# Patient Record
Sex: Female | Born: 1980
Health system: Southern US, Community
[De-identification: ages and names within clinical notes are randomized; demographics above are authoritative.]

## PROBLEM LIST (undated history)

## (undated) DIAGNOSIS — I499 Cardiac arrhythmia, unspecified: Secondary | ICD-10-CM

## (undated) DIAGNOSIS — C801 Malignant (primary) neoplasm, unspecified: Secondary | ICD-10-CM

## (undated) DIAGNOSIS — R87619 Unspecified abnormal cytological findings in specimens from cervix uteri: Secondary | ICD-10-CM

## (undated) DIAGNOSIS — IMO0002 Reserved for concepts with insufficient information to code with codable children: Secondary | ICD-10-CM

## (undated) DIAGNOSIS — O24419 Gestational diabetes mellitus in pregnancy, unspecified control: Secondary | ICD-10-CM

## (undated) HISTORY — DX: Malignant (primary) neoplasm, unspecified: C80.1

## (undated) HISTORY — DX: Reserved for concepts with insufficient information to code with codable children: IMO0002

## (undated) HISTORY — DX: Unspecified abnormal cytological findings in specimens from cervix uteri: R87.619

## (undated) HISTORY — PX: NO PAST SURGERIES: SHX2092

## (undated) HISTORY — DX: Gestational diabetes mellitus in pregnancy, unspecified control: O24.419

---

## 2004-01-09 ENCOUNTER — Other Ambulatory Visit: Admission: RE | Admit: 2004-01-09 | Discharge: 2004-01-09 | Payer: Self-pay | Admitting: Obstetrics and Gynecology

## 2007-09-12 ENCOUNTER — Encounter: Admission: RE | Admit: 2007-09-12 | Discharge: 2007-09-12 | Payer: Self-pay | Admitting: Obstetrics and Gynecology

## 2009-01-26 ENCOUNTER — Encounter: Admission: RE | Admit: 2009-01-26 | Discharge: 2009-01-26 | Payer: Self-pay | Admitting: Obstetrics and Gynecology

## 2009-02-13 ENCOUNTER — Encounter: Admission: RE | Admit: 2009-02-13 | Discharge: 2009-02-13 | Payer: Self-pay | Admitting: Family Medicine

## 2009-02-20 ENCOUNTER — Encounter: Admission: RE | Admit: 2009-02-20 | Discharge: 2009-02-20 | Payer: Self-pay | Admitting: Family Medicine

## 2009-02-25 ENCOUNTER — Emergency Department: Payer: Self-pay | Admitting: Emergency Medicine

## 2009-04-21 ENCOUNTER — Encounter: Admission: RE | Admit: 2009-04-21 | Discharge: 2009-04-21 | Payer: Self-pay | Admitting: Obstetrics and Gynecology

## 2009-07-16 ENCOUNTER — Encounter: Admission: RE | Admit: 2009-07-16 | Discharge: 2009-07-16 | Payer: Self-pay | Admitting: Obstetrics and Gynecology

## 2012-09-07 LAB — OB RESULTS CONSOLE HEPATITIS B SURFACE ANTIGEN: Hepatitis B Surface Ag: NEGATIVE

## 2012-09-07 LAB — OB RESULTS CONSOLE ANTIBODY SCREEN: Antibody Screen: NEGATIVE

## 2012-09-07 LAB — OB RESULTS CONSOLE ABO/RH: RH Type: POSITIVE

## 2012-09-07 LAB — OB RESULTS CONSOLE RUBELLA ANTIBODY, IGM: Rubella: IMMUNE

## 2012-11-14 NOTE — L&D Delivery Note (Signed)
Delivery Note At 6:44 PM a viable and healthy female was delivered via Vaginal, Spontaneous Delivery (Presentation: Right Occiput Anterior).  APGAR: 8, 9; weight pending .   Placenta status: Intact, Spontaneous.  Cord: 3 vessels with the following complications: None.  Cord pH: na  Anesthesia: Epidural  Episiotomy: None Lacerations: 2nd degree;Perineal Suture Repair: 2.0 vicryl rapide Est. Blood Loss (mL): 200  Mom to postpartum.  Baby to nursery-stable.  Verta Riedlinger J 04/18/2013, 7:02 PM

## 2013-02-06 ENCOUNTER — Encounter: Payer: 59 | Attending: Obstetrics | Admitting: *Deleted

## 2013-02-06 VITALS — Ht 61.0 in | Wt 123.6 lb

## 2013-02-06 DIAGNOSIS — Z713 Dietary counseling and surveillance: Secondary | ICD-10-CM | POA: Insufficient documentation

## 2013-02-06 DIAGNOSIS — O99810 Abnormal glucose complicating pregnancy: Secondary | ICD-10-CM | POA: Insufficient documentation

## 2013-02-07 ENCOUNTER — Encounter: Payer: Self-pay | Admitting: *Deleted

## 2013-02-07 NOTE — Progress Notes (Signed)
  Patient was seen on 02/06/13 for Gestational Diabetes self-management class at the [facility]. The following learning objectives were met by the patient during this course:   States the definition of Gestational Diabetes  States why dietary management is important in controlling blood glucose  Describes the effects each nutrient has on blood glucose levels  Demonstrates ability to create a balanced meal plan  Demonstrates carbohydrate counting   States when to check blood glucose levels  Demonstrates proper blood glucose monitoring techniques  States the effect of stress and exercise on blood glucose levels  States the importance of limiting caffeine and abstaining from alcohol and smoking  Blood glucose monitor given: none.  Already had a meter Blood glucose reading: 106 mg/dl  Patient instructed to monitor glucose levels: FBS: 60 - <90 1 hour: <140 2 hour: <120  *Patient received handouts:  Nutrition Diabetes and Pregnancy  Carbohydrate Counting List  Patient will be seen for follow-up as needed.

## 2013-02-07 NOTE — Patient Instructions (Signed)
Goals:  Check glucose levels per MD as instructed  Follow Gestational Diabetes Diet as instructed  Call for follow-up as needed    

## 2013-03-18 LAB — OB RESULTS CONSOLE GBS: GBS: POSITIVE

## 2013-04-15 ENCOUNTER — Encounter (HOSPITAL_COMMUNITY): Payer: Self-pay | Admitting: *Deleted

## 2013-04-15 ENCOUNTER — Telehealth (HOSPITAL_COMMUNITY): Payer: Self-pay | Admitting: *Deleted

## 2013-04-15 NOTE — Telephone Encounter (Signed)
Preadmission screen  

## 2013-04-17 ENCOUNTER — Other Ambulatory Visit: Payer: Self-pay | Admitting: Obstetrics

## 2013-04-18 ENCOUNTER — Inpatient Hospital Stay (HOSPITAL_COMMUNITY)
Admission: AD | Admit: 2013-04-18 | Discharge: 2013-04-20 | DRG: 775 | Disposition: A | Discharge status: Home / Self Care | Payer: 59 | Source: Ambulatory Visit | Attending: Obstetrics and Gynecology | Admitting: Obstetrics and Gynecology

## 2013-04-18 ENCOUNTER — Inpatient Hospital Stay (HOSPITAL_COMMUNITY): Payer: 59 | Admitting: Anesthesiology

## 2013-04-18 ENCOUNTER — Encounter (HOSPITAL_COMMUNITY): Payer: Self-pay | Admitting: *Deleted

## 2013-04-18 ENCOUNTER — Encounter (HOSPITAL_COMMUNITY): Payer: Self-pay | Admitting: Anesthesiology

## 2013-04-18 DIAGNOSIS — O99892 Other specified diseases and conditions complicating childbirth: Secondary | ICD-10-CM | POA: Diagnosis present

## 2013-04-18 DIAGNOSIS — O99814 Abnormal glucose complicating childbirth: Secondary | ICD-10-CM | POA: Diagnosis present

## 2013-04-18 DIAGNOSIS — Z22330 Carrier of Group B streptococcus: Secondary | ICD-10-CM

## 2013-04-18 HISTORY — DX: Cardiac arrhythmia, unspecified: I49.9

## 2013-04-18 LAB — COMPREHENSIVE METABOLIC PANEL
ALT: 16 U/L (ref 0–35)
AST: 25 U/L (ref 0–37)
Albumin: 2.8 g/dL — ABNORMAL LOW (ref 3.5–5.2)
Alkaline Phosphatase: 185 U/L — ABNORMAL HIGH (ref 39–117)
Potassium: 3.8 mEq/L (ref 3.5–5.1)
Sodium: 136 mEq/L (ref 135–145)
Total Protein: 6.5 g/dL (ref 6.0–8.3)

## 2013-04-18 LAB — CBC
HCT: 39 % (ref 36.0–46.0)
Hemoglobin: 12.3 g/dL (ref 12.0–15.0)
MCH: 32.2 pg (ref 26.0–34.0)
MCV: 93.5 fL (ref 78.0–100.0)
Platelets: 149 10*3/uL — ABNORMAL LOW (ref 150–400)
RBC: 3.82 MIL/uL — ABNORMAL LOW (ref 3.87–5.11)
RDW: 13 % (ref 11.5–15.5)
WBC: 9.7 10*3/uL (ref 4.0–10.5)

## 2013-04-18 MED ORDER — FENTANYL 2.5 MCG/ML BUPIVACAINE 1/10 % EPIDURAL INFUSION (WH - ANES)
14.0000 mL/h | INTRAMUSCULAR | Status: DC | PRN
  Administered 2013-04-18: 14 mL/h via EPIDURAL
  Filled 2013-04-18: qty 125

## 2013-04-18 MED ORDER — SENNOSIDES-DOCUSATE SODIUM 8.6-50 MG PO TABS: 2.0000 | ORAL_TABLET | Freq: Every day | ORAL | Status: DC

## 2013-04-18 MED ORDER — OXYTOCIN 40 UNITS IN LACTATED RINGERS INFUSION - SIMPLE MED: 1.0000 m[IU]/min | INTRAVENOUS | Status: DC

## 2013-04-18 MED ORDER — LACTATED RINGERS IV SOLN
500.0000 mL | INTRAVENOUS | Status: DC | PRN
  Administered 2013-04-18 (×2): 300 mL via INTRAVENOUS

## 2013-04-18 MED ORDER — EPHEDRINE 5 MG/ML INJ
10.0000 mg | INTRAVENOUS | Status: DC | PRN
  Filled 2013-04-18: qty 4
  Filled 2013-04-18: qty 2

## 2013-04-18 MED ORDER — OXYTOCIN BOLUS FROM INFUSION
500.0000 mL | INTRAVENOUS | Status: DC
  Administered 2013-04-18: 500 mL via INTRAVENOUS

## 2013-04-18 MED ORDER — OXYCODONE-ACETAMINOPHEN 5-325 MG PO TABS: 1.0000 | ORAL_TABLET | ORAL | Status: DC | PRN

## 2013-04-18 MED ORDER — IBUPROFEN 600 MG PO TABS: 600.0000 mg | ORAL_TABLET | Freq: Four times a day (QID) | ORAL | Status: DC | PRN

## 2013-04-18 MED ORDER — OXYTOCIN 40 UNITS IN LACTATED RINGERS INFUSION - SIMPLE MED
62.5000 mL/h | INTRAVENOUS | Status: DC
  Filled 2013-04-18: qty 1000

## 2013-04-18 MED ORDER — PENICILLIN G POTASSIUM 5000000 UNITS IJ SOLR
2.5000 10*6.[IU] | INTRAVENOUS | Status: DC
  Administered 2013-04-18 (×2): 2.5 10*6.[IU] via INTRAVENOUS
  Filled 2013-04-18 (×5): qty 2.5

## 2013-04-18 MED ORDER — WITCH HAZEL-GLYCERIN EX PADS: 1.0000 "application " | MEDICATED_PAD | CUTANEOUS | Status: DC | PRN

## 2013-04-18 MED ORDER — CITRIC ACID-SODIUM CITRATE 334-500 MG/5ML PO SOLN: 30.0000 mL | ORAL | Status: DC | PRN

## 2013-04-18 MED ORDER — TERBUTALINE SULFATE 1 MG/ML IJ SOLN: 0.2500 mg | Freq: Once | INTRAMUSCULAR | Status: DC | PRN

## 2013-04-18 MED ORDER — LACTATED RINGERS IV SOLN
500.0000 mL | Freq: Once | INTRAVENOUS | Status: AC
  Administered 2013-04-18: 500 mL via INTRAVENOUS

## 2013-04-18 MED ORDER — DIPHENHYDRAMINE HCL 50 MG/ML IJ SOLN: 12.5000 mg | INTRAMUSCULAR | Status: DC | PRN

## 2013-04-18 MED ORDER — LACTATED RINGERS IV SOLN
INTRAVENOUS | Status: DC
  Administered 2013-04-18 (×2): via INTRAVENOUS

## 2013-04-18 MED ORDER — TETANUS-DIPHTH-ACELL PERTUSSIS 5-2.5-18.5 LF-MCG/0.5 IM SUSP: 0.5000 mL | Freq: Once | INTRAMUSCULAR | Status: DC

## 2013-04-18 MED ORDER — ACETAMINOPHEN 325 MG PO TABS: 650.0000 mg | ORAL_TABLET | ORAL | Status: DC | PRN

## 2013-04-18 MED ORDER — IBUPROFEN 600 MG PO TABS
600.0000 mg | ORAL_TABLET | Freq: Four times a day (QID) | ORAL | Status: DC
  Administered 2013-04-18 – 2013-04-20 (×7): 600 mg via ORAL
  Filled 2013-04-18 (×9): qty 1

## 2013-04-18 MED ORDER — ONDANSETRON HCL 4 MG/2ML IJ SOLN: 4.0000 mg | Freq: Four times a day (QID) | INTRAMUSCULAR | Status: DC | PRN

## 2013-04-18 MED ORDER — METHYLERGONOVINE MALEATE 0.2 MG PO TABS
0.2000 mg | ORAL_TABLET | ORAL | Status: DC | PRN
Start: 2013-04-18 — End: 2013-04-19

## 2013-04-18 MED ORDER — PHENYLEPHRINE 40 MCG/ML (10ML) SYRINGE FOR IV PUSH (FOR BLOOD PRESSURE SUPPORT)
80.0000 ug | PREFILLED_SYRINGE | INTRAVENOUS | Status: DC | PRN
  Filled 2013-04-18: qty 2

## 2013-04-18 MED ORDER — LIDOCAINE HCL (PF) 1 % IJ SOLN
INTRAMUSCULAR | Status: DC | PRN
  Administered 2013-04-18 (×4): 4 mL

## 2013-04-18 MED ORDER — PRENATAL MULTIVITAMIN CH
1.0000 | ORAL_TABLET | Freq: Every day | ORAL | Status: DC
  Filled 2013-04-18 (×2): qty 1

## 2013-04-18 MED ORDER — LANOLIN HYDROUS EX OINT: TOPICAL_OINTMENT | CUTANEOUS | Status: DC | PRN

## 2013-04-18 MED ORDER — LIDOCAINE HCL (PF) 1 % IJ SOLN
30.0000 mL | INTRAMUSCULAR | Status: DC | PRN
  Filled 2013-04-18 (×2): qty 30

## 2013-04-18 MED ORDER — BENZOCAINE-MENTHOL 20-0.5 % EX AERO
1.0000 "application " | INHALATION_SPRAY | CUTANEOUS | Status: DC | PRN
  Filled 2013-04-18: qty 56

## 2013-04-18 MED ORDER — METHYLERGONOVINE MALEATE 0.2 MG/ML IJ SOLN: 0.2000 mg | INTRAMUSCULAR | Status: DC | PRN

## 2013-04-18 MED ORDER — ZOLPIDEM TARTRATE 5 MG PO TABS: 5.0000 mg | ORAL_TABLET | Freq: Every evening | ORAL | Status: DC | PRN

## 2013-04-18 MED ORDER — OXYTOCIN 40 UNITS IN LACTATED RINGERS INFUSION - SIMPLE MED
1.0000 m[IU]/min | INTRAVENOUS | Status: DC
  Administered 2013-04-18: 2 m[IU]/min via INTRAVENOUS

## 2013-04-18 MED ORDER — DEXTROSE 5 % IV SOLN
5.0000 10*6.[IU] | Freq: Once | INTRAVENOUS | Status: AC
  Administered 2013-04-18: 5 10*6.[IU] via INTRAVENOUS
  Filled 2013-04-18: qty 5

## 2013-04-18 MED ORDER — PHENYLEPHRINE 40 MCG/ML (10ML) SYRINGE FOR IV PUSH (FOR BLOOD PRESSURE SUPPORT)
80.0000 ug | PREFILLED_SYRINGE | INTRAVENOUS | Status: DC | PRN
  Filled 2013-04-18: qty 5
  Filled 2013-04-18: qty 2

## 2013-04-18 MED ORDER — ONDANSETRON HCL 4 MG/2ML IJ SOLN: 4.0000 mg | INTRAMUSCULAR | Status: DC | PRN

## 2013-04-18 MED ORDER — ONDANSETRON HCL 4 MG PO TABS: 4.0000 mg | ORAL_TABLET | ORAL | Status: DC | PRN

## 2013-04-18 MED ORDER — SIMETHICONE 80 MG PO CHEW: 80.0000 mg | CHEWABLE_TABLET | ORAL | Status: DC | PRN

## 2013-04-18 MED ORDER — FLEET ENEMA 7-19 GM/118ML RE ENEM: 1.0000 | ENEMA | RECTAL | Status: DC | PRN

## 2013-04-18 MED ORDER — DIBUCAINE 1 % RE OINT: 1.0000 "application " | TOPICAL_OINTMENT | RECTAL | Status: DC | PRN

## 2013-04-18 MED ORDER — DIPHENHYDRAMINE HCL 25 MG PO CAPS: 25.0000 mg | ORAL_CAPSULE | Freq: Four times a day (QID) | ORAL | Status: DC | PRN

## 2013-04-18 MED ORDER — EPHEDRINE 5 MG/ML INJ
10.0000 mg | INTRAVENOUS | Status: DC | PRN
  Filled 2013-04-18: qty 2

## 2013-04-18 NOTE — Anesthesia Procedure Notes (Signed)
Epidural Patient location during procedure: OB Start time: 04/18/2013 11:44 AM  Staffing Performed by: anesthesiologist   Preanesthetic Checklist Completed: patient identified, site marked, surgical consent, pre-op evaluation, timeout performed, IV checked, risks and benefits discussed and monitors and equipment checked  Epidural Patient position: sitting Prep: site prepped and draped and DuraPrep Patient monitoring: continuous pulse ox and blood pressure Approach: midline Injection technique: LOR air  Needle:  Needle type: Tuohy  Needle gauge: 17 G Needle length: 9 cm and 9 Needle insertion depth: 5 cm cm Catheter type: closed end flexible Catheter size: 19 Gauge Catheter at skin depth: 10 cm Test dose: negative  Assessment Events: blood aspirated, injection not painful, no injection resistance, negative IV test and no paresthesia  Additional Notes Discussed risk of headache, infection, bleeding, nerve injury and failed or incomplete block.  Patient voices understanding and wishes to proceed.  First attempt with heme from catheter - catheter removed without test dosing.  Copious heme from skin puncture.  Pressure held for over 5 minutes to achieve hemostasis.   Second attempt at L4-5, uneventful placement.  No significant bleeding from skin puncture at this site.  Heme-tinged aspirate from epidural space - feel it is from previous epidural vein puncture.  All test doses negative and patient comfortable after bolusing.  Jasmine December, MD  Reason for block:procedure for pain

## 2013-04-18 NOTE — Progress Notes (Signed)
Kristy [name] is a 32 y.o. G1P0 at [redacted]w[redacted]d by LMP admitted for active labor  Subjective: Appears comfortable  Objective: BP 123/83  Pulse 60  Temp(Src) 97.7 F (36.5 C) (Oral)  Resp 20  Ht 5\' 1"  (1.549 m)  Wt 59.24 kg (130 lb 9.6 oz)  BMI 24.69 kg/m2  LMP 07/06/2012      FHT:  FHR: 145 bpm, variability: moderate,  accelerations:  Present,  decelerations:  Absent UC:   irregular, every 6 minutes SVE:   Dilation: 3.5 Effacement (%): 70 Station: -1 Exam by:: Mekhia Brogan, MD AROM- ? clear  Labs: Lab Results  Component Value Date   WBC 9.7 04/18/2013   HGB 13.5 04/18/2013   HCT 39.0 04/18/2013   MCV 92.9 04/18/2013   PLT 149* 04/18/2013   CMP     Component Value Date/Time   NA 136 04/18/2013 0850   K 3.8 04/18/2013 0850   CL 105 04/18/2013 0850   CO2 22 04/18/2013 0850   GLUCOSE 76 04/18/2013 0850   BUN 10 04/18/2013 0850   CREATININE 0.68 04/18/2013 0850   CALCIUM 9.1 04/18/2013 0850   PROT 6.5 04/18/2013 0850   ALBUMIN 2.8* 04/18/2013 0850   AST 25 04/18/2013 0850   ALT 16 04/18/2013 0850   ALKPHOS 185* 04/18/2013 0850   BILITOT 0.2* 04/18/2013 0850   GFRNONAA >90 04/18/2013 0850   GFRAA >90 04/18/2013 0850     Assessment / Plan: Spontaneous labor, progressing normally, but hypotonic GBS pos on PCN GDM- BS 75 Elevated BP- improved, labs stable  Labor: Progressing normally- add Pitocin Preeclampsia:  labs stable Fetal Wellbeing:  Category I Pain Control:  Labor support without medications I/D:  n/a Anticipated MOD:  NSVD  Efraim Vanallen J 04/18/2013, 10:10 AM

## 2013-04-18 NOTE — Progress Notes (Signed)
Pt states she is feeling shaky. Pt requests to have her blood sugar taken.

## 2013-04-18 NOTE — Anesthesia Preprocedure Evaluation (Signed)
Anesthesia Evaluation  Patient identified by MRN, date of birth, ID band Patient awake    Reviewed: Allergy & Precautions, H&P , NPO status , Patient's Chart, lab work & pertinent test results, reviewed documented beta blocker date and time   History of Anesthesia Complications Negative for: history of anesthetic complications  Airway Mallampati: I TM Distance: >3 FB Neck ROM: full    Dental  (+) Teeth Intact   Pulmonary neg pulmonary ROS,  breath sounds clear to auscultation        Cardiovascular negative cardio ROS  Rhythm:regular Rate:Normal     Neuro/Psych PSYCHIATRIC DISORDERS (anxiety/depression) negative neurological ROS     GI/Hepatic Neg liver ROS, GERD-  ,  Endo/Other  diabetes, Gestational  Renal/GU negative Renal ROS  negative genitourinary   Musculoskeletal   Abdominal   Peds  Hematology negative hematology ROS (+)   Anesthesia Other Findings   Reproductive/Obstetrics (+) Pregnancy                           Anesthesia Physical Anesthesia Plan  ASA: II  Anesthesia Plan: Epidural   Post-op Pain Management:    Induction:   Airway Management Planned:   Additional Equipment:   Intra-op Plan:   Post-operative Plan:   Informed Consent: I have reviewed the patients History and Physical, chart, labs and discussed the procedure including the risks, benefits and alternatives for the proposed anesthesia with the patient or authorized representative who has indicated his/her understanding and acceptance.     Plan Discussed with:   Anesthesia Plan Comments:         Anesthesia Quick Evaluation

## 2013-04-18 NOTE — Progress Notes (Signed)
ZYLA DASCENZO is a 32 y.o. G1P0 at [redacted]w[redacted]d by LMP admitted for active labor.  Subjective: Uncomfortable  Objective: BP 136/79  Pulse 53  Temp(Src) 97.4 F (36.3 C) (Oral)  Resp 20  LMP 07/06/2012      FHT:  FHR: 155 bpm, variability: moderate,  accelerations:  Present,  decelerations:  Absent UC:   regular, every 5 minutes SVE:   Dilation: 2.5 Effacement (%): 80 Station: -2 Exam by:: B Mosca RN  Labs: pending No results found for this basename: WBC, HGB, HCT, MCV, PLT    Assessment / Plan: Spontaneous labor, progressing normally GDM GBS  Labor: Progressing normally Preeclampsia:  na Fetal Wellbeing:  Category I Pain Control:  Labor support without medications I/D:  n/a Anticipated MOD:  NSVD  Espiridion Supinski J 04/18/2013, 8:58 AM

## 2013-04-18 NOTE — MAU Note (Signed)
Pt reports bleeding since 1830 last night.  Pt also reports cramping and lower back pain.

## 2013-04-19 ENCOUNTER — Inpatient Hospital Stay (HOSPITAL_COMMUNITY): Admission: RE | Admit: 2013-04-19 | Payer: 59 | Source: Ambulatory Visit

## 2013-04-19 LAB — CBC
HCT: 29.9 % — ABNORMAL LOW (ref 36.0–46.0)
Hemoglobin: 10.3 g/dL — ABNORMAL LOW (ref 12.0–15.0)
MCHC: 34.4 g/dL (ref 30.0–36.0)
WBC: 12.3 10*3/uL — ABNORMAL HIGH (ref 4.0–10.5)

## 2013-04-19 NOTE — Progress Notes (Signed)
PPD 1 SVD  S:  Reports feeling tired - still sleeping intermittently this am             Tolerating po/ No nausea or vomiting             Bleeding is light             Pain controlled with motrin and percocet             Up ad lib / ambulatory / voiding QS  Newborn breast feeding  / female O:               VS: BP 108/71  Pulse 60  Temp(Src) 97.9 F (36.6 C) (Oral)  Resp 18  Ht 5\' 1"  (1.549 m)  Wt 59.24 kg (130 lb 9.6 oz)  BMI 24.69 kg/m2  SpO2 97%  LMP 07/06/2012   LABS:  Recent Labs  04/18/13 1915 04/19/13 0615  WBC 13.2* 12.3*  HGB 12.3 10.3*  PLT 131* 124*                                     I&O: Intake/Output     06/05 0701 - 06/06 0700 06/06 0701 - 06/07 0700   Urine (mL/kg/hr) 950 (0.7)    Blood 200 (0.1)    Total Output 1150     Net -1150                      Physical Exam:             Alert and oriented X3  Abdomen: soft, non-tender, non-distended              Fundus: firm, non-tender, Ueven  Perineum: ice pack in place  Lochia: light  Extremities: no edema, no calf pain or tenderness   A: PPD # 1   Doing well - stable status  P: Routine post partum orders  Anticipate DC tomorrow AM  Marlinda Mike CNM, MSN, [facility] 04/19/2013, 8:24 AM

## 2013-04-19 NOTE — H&P (Signed)
NAMESALENE, [name]              ACCOUNT NO.:  [id number]  MEDICAL RECORD NO.:  [id number]  LOCATION:  9120                          FACILITY:  [facility]  PHYSICIAN:  Lenoard Aden, M.D.DATE OF BIRTH:  09-21-1981  DATE OF ADMISSION:  04/18/2013 DATE OF DISCHARGE:                             HISTORY & PHYSICAL   CHIEF COMPLAINT:  Active labor.  HISTORY OF PRESENT ILLNESS:  She is a 32 year old white female, G1, P0 at 40-4/7th weeks gestation, who presents with increased frequency of contractions today.  She has allergies to GARLIC and PREDNISONE.  MEDICATIONS:  Include prenatal vitamins and no other.  She is a nonsmoker, nondrinker.  She denies domestic or physical violence.  Personal history of insomnia, depression and poor domestic situation, anxiety, all on no medications.  FAMILY HISTORY:  Heart disease, breast cancer, prostate cancer, and hypertension.  Pregnancy complicated by GBS positivity and gestational diabetes.  Diet, well controlled.  Ultrasounds, up-to-date with appropriately grown fetus.  PHYSICAL EXAMINATION:  GENERAL:  She is a well-developed, well- nourished, white female, in no acute distress. HEENT:  Normal. NECK:  Supple.  Full range of motion. LUNGS:  Clear. HEART:  Regular rhythm. ABDOMEN:  Soft, gravid, nontender.  Estimated fetal weight per ultrasound was 7.5 pounds.  Cervix is 3-4 cm, 80%, vertex, -1. EXTREMITIES:  There are no cords. NEUROLOGIC:  Nonfocal. SKIN:  Intact.  IMPRESSION: 1. A 40-week intrauterine pregnancy. 2. Elevated blood pressure.  No signs and symptoms of preeclampsia.     Labs normal. 3. Gestational diabetes, diet controlled. 4. GBS positive.  PLAN:  Penicillin, Pitocin augmentation.  Anticipate attempts at vaginal delivery.     Lenoard Aden, M.D.     RJT/MEDQ  D:  04/18/2013  T:  04/18/2013  Job:  [id number]

## 2013-04-19 NOTE — Anesthesia Postprocedure Evaluation (Signed)
  Anesthesia Post-op Note  Patient: Kristy [name]  Procedure(s) Performed: * No procedures listed *  Patient Location: Mother/Baby  Anesthesia Type:Epidural  Level of Consciousness: awake, alert  and oriented  Airway and Oxygen Therapy: Patient Spontanous Breathing  Post-op Pain: moderate, pain @ epidural site, however no bruising or bleeding present when assesed by self, pt reassured the achiness in normal & shld resolve w/ time, Dr Jean Rosenthal notifed  Post-op Assessment: Post-op Vital signs reviewed, Pain level controlled, No headache, No residual numbness and No residual motor weakness  Post-op Vital Signs: Reviewed and stable  Complications: No apparent anesthesia complications

## 2013-04-19 NOTE — Progress Notes (Signed)
CSW attempted to meet with pt however her spouse was present.  CSW will return when he is no longer visiting.

## 2013-04-19 NOTE — Lactation Note (Signed)
This note was copied from the chart of Girl Darlette Dubow. Lactation Consultation Note  Patient Name: Girl Meklit Cotta BJYNW'G Date: 04/19/2013 Reason for consult: Initial assessment   Maternal Data Formula Feeding for Exclusion: No Infant to breast within first hour of birth: Yes Has patient been taught Hand Expression?: Yes Does the patient have breastfeeding experience prior to this delivery?: No  Feeding Feeding Type: Breast Milk Feeding method: Breast  LATCH Score/Interventions Latch: Too sleepy or reluctant, no latch achieved, no sucking elicited. Intervention(s): Adjust position;Assist with latch;Breast compression  Audible Swallowing: None  Type of Nipple: Everted at rest and after stimulation Intervention(s): Hand pump  Comfort (Breast/Nipple): Soft / non-tender     Hold (Positioning): Assistance needed to correctly position infant at breast and maintain latch. Intervention(s): Breastfeeding basics reviewed;Support Pillows;Skin to skin;Position options  LATCH Score: 5  Lactation Tools Discussed/Used     Consult Status Consult Status: Follow-up Date: 04/20/13 Follow-up type: In-patient  Initial visit with mom. She reports that when the baby is awake she latches well. Mom reports that she is having some difficulty with positioning. Reviewed wide open mouth and having the baby deep onto the breast. Attempted to latch baby but she is too sleepy. Skin to skin with mom. BFf brochure given with resources for support after DC. No questions at present. To call for assist prn  Pamelia Hoit 04/19/2013, 11:31 AM

## 2013-04-20 MED ORDER — OXYCODONE-ACETAMINOPHEN 5-325 MG PO TABS: 1.0000 | ORAL_TABLET | ORAL | Status: DC | PRN

## 2013-04-20 MED ORDER — IBUPROFEN 600 MG PO TABS: 600.0000 mg | ORAL_TABLET | Freq: Four times a day (QID) | ORAL | Status: DC

## 2013-04-20 NOTE — Discharge Summary (Signed)
Obstetric Discharge Summary  Reason for Admission: onset of labor Prenatal Procedures: none Intrapartum Procedures: spontaneous vaginal delivery and GBS prophylaxis Postpartum Procedures: none Complications-Operative and Postpartum: 2nd degree perineal laceration Hemoglobin  Date Value Range Status  04/19/2013 10.3* 12.0 - 15.0 g/dL Final     HCT  Date Value Range Status  04/19/2013 29.9* 36.0 - 46.0 % Final    Physical Exam:  General: alert, cooperative and no distress Lochia: appropriate Uterine Fundus: firm Incision: healing well DVT Evaluation: No evidence of DVT seen on physical exam.  Discharge Diagnoses: Term Pregnancy-delivered  Discharge Information: Date: 04/20/2013 Activity: pelvic rest Diet: routine Medications: PNV, Ibuprofen and Percocet Condition: stable Instructions: refer to practice specific booklet Discharge to: home Follow-up Information   Follow up with Lenoard Aden, MD. Schedule an appointment as soon as possible for a visit in 6 weeks.   Contact information:   [address] [place] [address] [phone]       Newborn Data: Live born female  Birth Weight: 6 lb 10.5 oz (3020 g) APGAR: 8, 9  Home with mother.  Marlinda Mike 04/20/2013, 8:46 AM

## 2013-04-20 NOTE — Clinical Social Work Note (Signed)
CSW attempted to see MOB again, however lactation was working with MOB and infant.  CSW will return to see MOB before discharge today.    [phone] 

## 2013-04-20 NOTE — Progress Notes (Signed)
PPD 2 SVD  S:  Reports feeling well - better today / some backache at epidural site             Tolerating po/ No nausea or vomiting             Bleeding is light             Pain controlled with motrin and percocet             Up ad lib / ambulatory / voiding QS  Newborn breast feeding   O:               VS: BP 116/71  Pulse 98  Temp(Src) 98.4 F (36.9 C) (Oral)  Resp 18  Ht 5\' 1"  (1.549 m)  Wt 59.24 kg (130 lb 9.6 oz)  BMI 24.69 kg/m2  SpO2 97%  LMP 07/06/2012              Physical Exam:             Alert and oriented X3  Abdomen: soft, non-tender, non-distended              Fundus: firm, non-tender, U-1  Perineum: mild edema  Lochia: light  Extremities: no edema, no calf pain or tenderness  A: PPD # 2   Doing well - stable status  P: Routine post partum orders  DC home             WOB booklet / instructions reviewed  Marlinda Mike CNM, MSN, [facility] 04/20/2013, 8:43 AM

## 2013-04-20 NOTE — Progress Notes (Signed)
Infant staying as a baby patient.

## 2013-04-20 NOTE — Clinical Social Work Note (Signed)
CSW spoke with MOB. No barriers to discharge at this time.  Full consult report to follow.   [phone]

## 2013-04-21 NOTE — Clinical Social Work Note (Signed)
Late Entry  CSW spoke alone with MOB at bedside at late evening 04/20/13.  MOB reports no current concerns with anxiety or depression at this time.  CSW discussed hx of separation.  MOB reports some marital issues during pregnancy, however none currently.  MOB did not want to go into detail with CSW.  MOB reports no hx or concerns with DV or any safety issues.  Please reconsult CSW if further needs arise.   [phone]

## 2013-04-29 ENCOUNTER — Ambulatory Visit: Payer: Self-pay

## 2013-04-29 NOTE — Lactation Note (Signed)
This note was copied from the chart of Melodye Ped. Adult Lactation Consultation Outpatient Visit Note  Patient Name: Makalya Nave Date of Birth: 04/18/2013 Gestational Age at Delivery: Unknown Type of Delivery:   Breastfeeding History: Frequency of Breastfeeding: Q 2-4 hours- feeds more often in the daytime Length of Feeding: 30-45 Voids: Lots Has 2 voids while here for appointment Stools: Lots- had 2 stools while here  Supplementing / Method: Pumping:  Type of Pump: Medela   Frequency:4-5 times/ Day   Volume:  30-120 cc's  Comments:    Consultation Evaluation:  Initial Feeding Assessment: Pre-feed Weight: 7-2.3  3240g Post-feed Weight:7- 4.5  3304 Amount Transferred:64 cc's Comments: Baby latched well to right breast in cradle position. After I untucked baby's bottom lip, mom reports that latch feels fine. Baby nursed for 15 minutes with lots of swallows noted. Encouragement given.  Additional Feeding Assessment:Diaper change Pre-feed Weight: 7-3.7  3280 Post-feed Weight: 7- 4.0  3288 Amount Transferred:8 cc's Comments: Avery latched but did not stay on the breast for very long. Alert but not showing any feeding cues. Reviewed positioning Avery at the breast in football position. Mom is doing lots of pumping and bottle feeding EBM. Encouraged to put the baby to the breast every feeding and to pump if Denny Peon does not empty the breast. No further questions at present. To call prn    Total Breast milk Transferred this Visit: 73 cc's Total Supplement Given: mom was feeding Denny Peon EBM from a bottle before we got started- about 20 cc's  Additional Interventions:   Follow-Up With Ped [facility] as resource for support and weight check.     Pamelia Hoit 04/29/2013, 2:34 PM

## 2013-05-01 NOTE — Progress Notes (Signed)
Post discharge chart review completed.  

## 2013-06-17 ENCOUNTER — Ambulatory Visit (HOSPITAL_COMMUNITY)
Admission: RE | Admit: 2013-06-17 | Discharge: 2013-06-17 | Disposition: A | Discharge status: Home / Self Care | Payer: 59 | Source: Ambulatory Visit | Attending: Obstetrics | Admitting: Obstetrics

## 2013-06-17 NOTE — Lactation Note (Signed)
Adult Lactation Consultation Outpatient Visit Note  Patient Name: Kristy [name] Date of Birth: 12/09/1980 Gestational Age at Delivery: Unknown Type of Delivery:   Breastfeeding History: Frequency of Breastfeeding:  Length of Feeding:  Voids:  Stools:   Supplementing / Method: Pumping:  Type of Pump:   Frequency:  Volume:    Comments: Kristy [name]'s infant is 38 weeks old. She has been complaining of pain in lower (L) breast since July 6th . She states that she saw Dr. Ernestina Penna last week. She states that Dr. Ernestina Penna did a breast exam and was unable to see a cause.  Kristy [name] describes her pain as random and shooting. She states maybe days or weeks between pain in (L) breast. She states she was unsure if pumping her breast has caused this pain. She has had difficulty with a faulty pump but now she has a new one. She has changed flange sizes frequently and is now using a #27 flange. She denies having had any fever or chills or flu like symptoms. Kristy [name] states that her Mother has history of Breast CA at 75 years old. Kristy [name] has had several 3-D ultrasounds and states that there were no significant findings other than fibrocystic breast disease.    Consultation Evaluation: Breast are full and slightly firm. Mother doesn't have any break or redness in nipple tissue. Assessed both breast and observed a small round knot at 6 o'clock on (L) breast. Area approximately 1 cm in size. Area is approximately 1-2 inches below nipple and 1 inch below pressure point from flange. No observed redness on breast and no plugged duct observed.  Assist mother with pumping breast for 20 mins. Mother described shooting pain about 15 mins into pumping cession. She states that pain scale rises to a scale of #6. Only moments of pain then the pain is gone and leaves breast sore.   Mothers breast were emptied with pumping for 20 mins. Firm area still present after good breast massage. Mother states area is still slightly  tender. She states that the pain feels more like a pulled muscle.   Mother has mild to moderate  anxiety about pain . I suggested that she seek another ultrasound since it has been two years since last one. Mother doesn't complain of  any signs of plugged ducts or  Signs or symptoms of Mastitis.     Additional Interventions: Mother encouraged  To seek another 3-D Ultrasound if Dr Ernestina Penna thinks is necessary.  Advised mother to minimize pressure on breast with flanges. Mother to decrease caffeine in diet  Follow-Up  PRN     Stevan Born [facility] 06/17/2013, 2:40 PM

## 2014-09-15 ENCOUNTER — Encounter (HOSPITAL_COMMUNITY): Payer: Self-pay | Admitting: *Deleted

## 2015-12-07 DIAGNOSIS — Z1151 Encounter for screening for human papillomavirus (HPV): Secondary | ICD-10-CM | POA: Diagnosis not present

## 2015-12-07 DIAGNOSIS — Z01419 Encounter for gynecological examination (general) (routine) without abnormal findings: Secondary | ICD-10-CM | POA: Diagnosis not present

## 2015-12-07 MED FILL — TERCONAZOLE 0.8% VAGINAL CR: 0.8 | 3 days supply | Qty: 20 | Fill #0

## 2015-12-10 LAB — HM PAP SMEAR: HM PAP: NEGATIVE

## 2015-12-15 ENCOUNTER — Encounter: Payer: Self-pay | Admitting: Family Medicine

## 2015-12-15 ENCOUNTER — Ambulatory Visit (INDEPENDENT_AMBULATORY_CARE_PROVIDER_SITE_OTHER): Payer: 59 | Admitting: Family Medicine

## 2015-12-15 VITALS — BP 120/80 | HR 64 | Ht 60.0 in | Wt 98.6 lb

## 2015-12-15 DIAGNOSIS — Z7189 Other specified counseling: Secondary | ICD-10-CM

## 2015-12-15 DIAGNOSIS — Z0000 Encounter for general adult medical examination without abnormal findings: Secondary | ICD-10-CM | POA: Diagnosis not present

## 2015-12-15 DIAGNOSIS — Z8632 Personal history of gestational diabetes: Secondary | ICD-10-CM | POA: Diagnosis not present

## 2015-12-15 DIAGNOSIS — Z8349 Family history of other endocrine, nutritional and metabolic diseases: Secondary | ICD-10-CM

## 2015-12-15 DIAGNOSIS — Z7689 Persons encountering health services in other specified circumstances: Secondary | ICD-10-CM

## 2015-12-15 DIAGNOSIS — Z83438 Family history of other disorder of lipoprotein metabolism and other lipidemia: Secondary | ICD-10-CM

## 2015-12-15 LAB — POCT URINALYSIS DIPSTICK
Bilirubin, UA: NEGATIVE
Blood, UA: NEGATIVE
Glucose, UA: NEGATIVE
KETONES UA: NEGATIVE
Nitrite, UA: NEGATIVE
PH UA: 6
PROTEIN UA: NEGATIVE
UROBILINOGEN UA: NEGATIVE

## 2015-12-15 LAB — COMPREHENSIVE METABOLIC PANEL
ALT: 13 U/L (ref 6–29)
AST: 16 U/L (ref 10–30)
Albumin: 4.1 g/dL (ref 3.6–5.1)
Alkaline Phosphatase: 49 U/L (ref 33–115)
BUN: 12 mg/dL (ref 7–25)
CHLORIDE: 104 mmol/L (ref 98–110)
CO2: 24 mmol/L (ref 20–31)
Calcium: 9.5 mg/dL (ref 8.6–10.2)
Creat: 0.7 mg/dL (ref 0.50–1.10)
GLUCOSE: 85 mg/dL (ref 65–99)
POTASSIUM: 4.4 mmol/L (ref 3.5–5.3)
Sodium: 137 mmol/L (ref 135–146)
TOTAL PROTEIN: 7.7 g/dL (ref 6.1–8.1)
Total Bilirubin: 0.5 mg/dL (ref 0.2–1.2)

## 2015-12-15 LAB — CBC WITH DIFFERENTIAL/PLATELET
BASOS PCT: 1 % (ref 0–1)
Basophils Absolute: 0 10*3/uL (ref 0.0–0.1)
EOS ABS: 0.1 10*3/uL (ref 0.0–0.7)
EOS PCT: 3 % (ref 0–5)
HCT: 39.4 % (ref 36.0–46.0)
Hemoglobin: 13.3 g/dL (ref 12.0–15.0)
LYMPHS ABS: 1.8 10*3/uL (ref 0.7–4.0)
Lymphocytes Relative: 39 % (ref 12–46)
MCH: 30.3 pg (ref 26.0–34.0)
MCHC: 33.8 g/dL (ref 30.0–36.0)
MCV: 89.7 fL (ref 78.0–100.0)
MONO ABS: 0.3 10*3/uL (ref 0.1–1.0)
MONOS PCT: 6 % (ref 3–12)
MPV: 9.5 fL (ref 8.6–12.4)
Neutro Abs: 2.4 10*3/uL (ref 1.7–7.7)
Neutrophils Relative %: 51 % (ref 43–77)
PLATELETS: 281 10*3/uL (ref 150–400)
RBC: 4.39 MIL/uL (ref 3.87–5.11)
RDW: 13.4 % (ref 11.5–15.5)
WBC: 4.7 10*3/uL (ref 4.0–10.5)

## 2015-12-15 LAB — LIPID PANEL
CHOL/HDL RATIO: 2 ratio (ref <=5.0)
Cholesterol: 143 mg/dL (ref 125–200)
HDL: 71 mg/dL (ref >=46)
LDL Cholesterol: 63 mg/dL (ref <130)
Triglycerides: 46 mg/dL (ref <150)
VLDL: 9 mg/dL (ref <30)

## 2015-12-15 LAB — POCT GLYCOSYLATED HEMOGLOBIN (HGB A1C)

## 2015-12-15 NOTE — Progress Notes (Signed)
Subjective:    Patient ID: Kristy [name], female    DOB: 11-08-1981, 35 y.o.   MRN: [id number]  HPI Chief Complaint  Patient presents with  . new pt    new pt cpe. wants to be rechecked for diabetes.    She is new to the practice and here to establish primary care. She is also here for a complete physical examination and fasting blood work. Last physical exam was 2009.  Concerns today include having her blood sugar checked as she had gestational diabetes when pregnant with her child. Her daughter is almost 3. No issues with blood sugar since and No other issues with pregnancy. 1 pregnancy in past.  Basal cell carcinoma on abdomen last year- had it removed.  She would like to discuss that she is wanting more time to herself and having an issue with childcare. Reports she is single mom and does not have family who lives in the area. Her brother does live in [place]. States she enjoys running and being involved in activities like soccer on saturdays. She is very active and states she is extroverted. She states she sometimes feels guilty for wanting time to herself or time to enjoy activities without her daughter. She states her work hours Temporarily changed which requires her to go in later in work later in the day. This has been interfering with her evening activities.  Other providers: Dr. Leanna Battles [facility], Dermatologist - [place] Past medical history: tachycardia in past - resolved. Denies history of depression or anxiety- removed these from the history.  Surgical history: none Social history: denies smoking, alcohol or drug use Family history significant for: breast cancer mother at age 36 and paternal aunt died from breast cancer at age 62. Prostate and skin cancer MGF, PGM died of lung cancer. Dad and brother with HTN, hyperlipidemia.   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  Lives with daughter. Divorced.   Pap smear: history of abnormal pap Mammogram: never Colonoscopy:  neer Eye exam- October 2016 Dentist - up to date.   Immunizations: Flu shot UTD Tdap UTD Works for [employer] and is UTD on all   Sunscreen- wears Seatbelt always wears Smoke detectors- checks these    Review of Systems Review of Systems Constitutional: -fever, -chills, -sweats, -unexpected weight change,-fatigue ENT: -runny nose, -ear pain, -sore throat Cardiology:  -chest pain, -palpitations, -edema Respiratory: -cough, -shortness of breath, -wheezing Gastroenterology: -abdominal pain, -nausea, -vomiting, -diarrhea, -constipation Hematology: -bleeding or bruising problems Musculoskeletal: -arthralgias, -myalgias, -joint swelling, -back pain Ophthalmology: -vision changes Urology: -dysuria, -difficulty urinating, -hematuria, -urinary frequency, -urgency Neurology: -headache, -weakness, -tingling, -numbness       Objective:   Physical Exam BP 120/80 mmHg  Pulse 64  Ht 5' (1.524 m)  Wt 98 lb 9.6 oz (44.725 kg)  BMI 19.26 kg/m2  LMP 11/30/2015  Breastfeeding? No  General Appearance:    Alert, cooperative, no distress, appears stated age  Head:    Normocephalic, without obvious abnormality, atraumatic  Eyes:    PERRL, conjunctiva/corneas clear, EOM's intact, fundi    benign  Ears:    Normal TM's and external ear canals  Nose:   Nares normal, mucosa normal, no drainage or sinus   tenderness  Throat:   Lips, mucosa, and tongue normal; teeth and gums normal  Neck:   Supple, no lymphadenopathy;  thyroid:  no   enlargement/tenderness/nodules; no carotid   bruit or JVD  Back:    Spine nontender, no curvature, ROM normal, no CVA  tenderness  Lungs:     Clear to auscultation bilaterally without wheezes, rales or     ronchi; respirations unlabored  Chest Wall:    No tenderness or deformity   Heart:    Regular rate and rhythm, S1 and S2 normal, no murmur, rub   or gallop  Breast Exam:    declined, performed at gynecologist in the past few months   Abdomen:     Soft, non-tender,  nondistended, normoactive bowel sounds,    no masses, no hepatosplenomegaly  Genitalia:   declined, performed at gynecologist in the past few months   Rectal:    Not performed due to age<40 and no related complaints  Extremities:   No clubbing, cyanosis or edema  Pulses:   2+ and symmetric all extremities  Skin:   Skin color, texture, turgor normal, no rashes or lesions  Lymph nodes:   Cervical, supraclavicular, and axillary nodes normal  Neurologic:   CNII-XII intact, normal strength, sensation and gait; reflexes 2+ and symmetric throughout          Psych:   Normal mood, affect, hygiene and grooming.     Urinalysis dipstick: trace of leukocytes, asymptomatic     Assessment & Plan:  Routine general medical examination at a health care facility - Plan: POCT urinalysis dipstick, CBC with Differential/Platelet, Comprehensive metabolic panel, TSH, Lipid panel  Encounter to establish care  History of gestational diabetes - Plan: POCT glycosylated hemoglobin (Hb A1C), Comprehensive metabolic panel, TSH  Family history of hyperlipidemia - Plan: Lipid panel  Discussed that her feelings of wanting to be more active and social and having time away from her daughter are perfectly normal and she should not feel guilty for this. Recommend that she ask friends to babysit for her or perhaps her brother in [place] in order to give her some more time for herself. Congratulated her on being a single mom, working full-time and still managing to exercise and take good care of herself. Discussed that being healthy also includes healthy mental state so she does need to continue doing activities that she enjoys and should not feel guilty for wanting a few hours to herself each week. Her urinalysis does show a trace of leukocytes however she is asymptomatic no treatment needed. Discussed that gestational diabetes does place her at a greater risk of developing diabetes in the future however if she continues eating  healthy diet and exercising that she can prevent or postpone this. Her A1c today is 5.3 and within normal range. Discussed safety and health maintenance. She will continue seeing her gynecologist for her Pap smears since she has had abnormal Pap smears in the past with colposcopy. She will need to start mammograms at age 36 due to family history. Will follow up pending blood work.

## 2015-12-15 NOTE — Patient Instructions (Signed)

## 2015-12-16 LAB — TSH: TSH: 1.694 u[IU]/mL (ref 0.350–4.500)

## 2016-03-02 ENCOUNTER — Encounter: Payer: Self-pay | Admitting: Family Medicine

## 2016-03-02 ENCOUNTER — Ambulatory Visit (INDEPENDENT_AMBULATORY_CARE_PROVIDER_SITE_OTHER): Payer: 59 | Admitting: Family Medicine

## 2016-03-02 VITALS — BP 118/68 | HR 64 | Temp 98.3°F | Wt 101.8 lb

## 2016-03-02 DIAGNOSIS — N644 Mastodynia: Secondary | ICD-10-CM | POA: Diagnosis not present

## 2016-03-02 NOTE — Progress Notes (Signed)
   Subjective:    Patient ID: Kristy [name], female    DOB: 01/23/1981, 35 y.o.   MRN: [id number]  HPI Chief Complaint  Patient presents with  . left breast pain    left breast pain- side pain- not round   She is here with complaints of a left upper outer breast having a hard mass for several days but does not seem as large today as it has been over past few weeks. Also reports area being tender but not as tender as last week. She states she was also examined by her GYN Dr. Pamala Hurry for same mass and was told that there was nothing worrisome there and that she should have a mammogram at age 29. She states she has continued to be worried about the area. Denies having any skin changes to the breast. She states right breast is fine.  Has recently starting drinking coffee and is curious if increasing caffeine has anything to do this. Had ultrasound in 2008 or 2009 and was found to have cyst to same breast.   LMP: 2 weeks ago  Denies fever, chills, weight change, fatigue, or cough.   Mother was diagnosed with breast cancer in her 60s. Aunt died from breast cancer in her 59s.     Past Medical History  Diagnosis Date  . Abnormal Pap smear   . GERD (gastroesophageal reflux disease)   . Dysrhythmia     tachycardia  . Gestational diabetes     Review of Systems Pertinent positives and negatives in the history of present illness.     Objective:   Physical Exam  Constitutional: She appears well-developed and well-nourished. No distress.  Pulmonary/Chest: Left breast exhibits no mass, no nipple discharge, no skin change and no tenderness. Breasts are symmetrical.  No discreet mass felt, no skin changes, nipple normal, mild tenderness to upper outer quadrant   BP 118/68 mmHg  Pulse 64  Temp(Src) 98.3 F (36.8 C) (Oral)  Wt 101 lb 12.8 oz (46.176 kg)      Assessment & Plan:  Breast tenderness  Discussed that I will send her for breast ultrasound based on significant family  history of breast cancer and to ease her mind but she would like to hold off on getting a breast ultrasound for now. Discussed that I do not feel a discrete mass and recommend watchful waiting. She will monitor caffeine intake and continue to do self breast exams noting changes month to month. She will let me or GYN know if she notices further changes and would like to go for [place].

## 2016-05-02 ENCOUNTER — Telehealth: Payer: Self-pay | Admitting: Internal Medicine

## 2016-05-02 DIAGNOSIS — N644 Mastodynia: Secondary | ICD-10-CM

## 2016-05-02 NOTE — Telephone Encounter (Signed)
Pt called and states that she is still having left upper quadrant breast pain. She said it got better but now its back and its moves and its gotten bigger and painful. Pt wants to know if she can go get an ultrasound done

## 2016-05-03 ENCOUNTER — Other Ambulatory Visit: Payer: Self-pay | Admitting: Family Medicine

## 2016-05-03 NOTE — Telephone Encounter (Signed)
I am ok with sending her for an ultrasound. Please take care of this.

## 2016-05-03 NOTE — Telephone Encounter (Signed)
Left message for pt to call me back.  FYI- [facility] and schedule her appt herself to her convenience. [phone]

## 2016-05-03 NOTE — Telephone Encounter (Signed)
Pt was notified and will call and schedule an appt

## 2016-05-10 ENCOUNTER — Ambulatory Visit
Admission: RE | Admit: 2016-05-10 | Discharge: 2016-05-10 | Disposition: A | Discharge status: Home / Self Care | Payer: 59 | Source: Ambulatory Visit | Attending: Family Medicine | Admitting: Family Medicine

## 2016-05-10 DIAGNOSIS — N644 Mastodynia: Secondary | ICD-10-CM

## 2016-08-10 ENCOUNTER — Ambulatory Visit (INDEPENDENT_AMBULATORY_CARE_PROVIDER_SITE_OTHER): Payer: 59 | Admitting: Family Medicine

## 2016-08-10 ENCOUNTER — Encounter: Payer: Self-pay | Admitting: Family Medicine

## 2016-08-10 VITALS — BP 120/70 | HR 61 | Wt 103.3 lb

## 2016-08-10 DIAGNOSIS — F329 Major depressive disorder, single episode, unspecified: Secondary | ICD-10-CM | POA: Diagnosis not present

## 2016-08-10 DIAGNOSIS — F32A Depression, unspecified: Secondary | ICD-10-CM

## 2016-08-10 MED ORDER — SERTRALINE HCL 50 MG PO TABS: 50.0000 mg | ORAL_TABLET | Freq: Every day | ORAL | 3 refills | Status: DC

## 2016-08-10 NOTE — Patient Instructions (Signed)
Sertraline tablets What is this medicine? SERTRALINE (SER tra leen) is used to treat depression. It may also be used to treat obsessive compulsive disorder, panic disorder, post-trauma stress, premenstrual dysphoric disorder (PMDD) or social anxiety. This medicine may be used for other purposes; ask your health care provider or pharmacist if you have questions. What should I tell my health care provider before I take this medicine? They need to know if you have any of these conditions: -bipolar disorder or a family history of bipolar disorder -diabetes -glaucoma -heart disease -high blood pressure -history of irregular heartbeat -history of low levels of calcium, magnesium, or potassium in the blood -if you often drink alcohol -liver disease -receiving electroconvulsive therapy -seizures -suicidal thoughts, plans, or attempt; a previous suicide attempt by you or a family member -thyroid disease -an unusual or allergic reaction to sertraline, other medicines, foods, dyes, or preservatives -pregnant or trying to get pregnant -breast-feeding How should I use this medicine? Take this medicine by mouth with a glass of water. Follow the directions on the prescription label. You can take it with or without food. Take your medicine at regular intervals. Do not take your medicine more often than directed. Do not stop taking this medicine suddenly except upon the advice of your doctor. Stopping this medicine too quickly may cause serious side effects or your condition may worsen. A special MedGuide will be given to you by the pharmacist with each prescription and refill. Be sure to read this information carefully each time. Talk to your pediatrician regarding the use of this medicine in children. While this drug may be prescribed for children as young as 7 years for selected conditions, precautions do apply. Overdosage: If you think you have taken too much of this medicine contact a poison control  center or emergency room at once. NOTE: This medicine is only for you. Do not share this medicine with others. What if I miss a dose? If you miss a dose, take it as soon as you can. If it is almost time for your next dose, take only that dose. Do not take double or extra doses. What may interact with this medicine? Do not take this medicine with any of the following medications: -certain medicines for fungal infections like fluconazole, itraconazole, ketoconazole, posaconazole, voriconazole -cisapride -disulfiram -dofetilide -linezolid -MAOIs like Carbex, Eldepryl, Marplan, Nardil, and Parnate -metronidazole -methylene blue (injected into a vein) -pimozide -thioridazine -ziprasidone This medicine may also interact with the following medications: -alcohol -aspirin and aspirin-like medicines -certain medicines for depression, anxiety, or psychotic disturbances -certain medicines for irregular heart beat like flecainide, propafenone -certain medicines for migraine headaches like almotriptan, eletriptan, frovatriptan, naratriptan, rizatriptan, sumatriptan, zolmitriptan -certain medicines for sleep -certain medicines for seizures like carbamazepine, valproic acid, phenytoin -certain medicines that treat or prevent blood clots like warfarin, enoxaparin, dalteparin -cimetidine -digoxin -diuretics -fentanyl -furazolidone -isoniazid -lithium -NSAIDs, medicines for pain and inflammation, like ibuprofen or naproxen -other medicines that prolong the QT interval (cause an abnormal heart rhythm) -procarbazine -rasagiline -supplements like St. John's wort, kava kava, valerian -tolbutamide -tramadol -tryptophan This list may not describe all possible interactions. Give your health care provider a list of all the medicines, herbs, non-prescription drugs, or dietary supplements you use. Also tell them if you smoke, drink alcohol, or use illegal drugs. Some items may interact with your  medicine. What should I watch for while using this medicine? Tell your doctor if your symptoms do not get better or if they get worse. Visit your doctor   or health care professional for regular checks on your progress. Because it may take several weeks to see the full effects of this medicine, it is important to continue your treatment as prescribed by your doctor. Patients and their families should watch out for new or worsening thoughts of suicide or depression. Also watch out for sudden changes in feelings such as feeling anxious, agitated, panicky, irritable, hostile, aggressive, impulsive, severely restless, overly excited and hyperactive, or not being able to sleep. If this happens, especially at the beginning of treatment or after a change in dose, call your health care professional. You may get drowsy or dizzy. Do not drive, use machinery, or do anything that needs mental alertness until you know how this medicine affects you. Do not stand or sit up quickly, especially if you are an older patient. This reduces the risk of dizzy or fainting spells. Alcohol may interfere with the effect of this medicine. Avoid alcoholic drinks. Your mouth may get dry. Chewing sugarless gum or sucking hard candy, and drinking plenty of water may help. Contact your doctor if the problem does not go away or is severe. What side effects may I notice from receiving this medicine? Side effects that you should report to your doctor or health care professional as soon as possible: -allergic reactions like skin rash, itching or hives, swelling of the face, lips, or tongue -black or bloody stools, blood in the urine or vomit -fast, irregular heartbeat -feeling faint or lightheaded, falls -hallucination, loss of contact with reality -seizures -suicidal thoughts or other mood changes -unusual bleeding or bruising -unusually weak or tired -vomiting Side effects that usually do not require medical attention (report to your  doctor or health care professional if they continue or are bothersome): -change in appetite -change in sex drive or performance -diarrhea -increased sweating -indigestion, nausea -tremors This list may not describe all possible side effects. Call your doctor for medical advice about side effects. You may report side effects to FDA at 1-800-FDA-1088. Where should I keep my medicine? Keep out of the reach of children. Store at room temperature between 15 and 30 degrees C (59 and 86 degrees F). Throw away any unused medicine after the expiration date. NOTE: This sheet is a summary. It may not cover all possible information. If you have questions about this medicine, talk to your doctor, pharmacist, or health care provider.    2016, Elsevier/Gold Standard. (2013-05-28 12:57:35)  

## 2016-08-10 NOTE — Progress Notes (Signed)
   Subjective:    Patient ID: Kristy [name], female    DOB: [birth year], 35 y.o.   MRN: [id number]  HPI Chief Complaint  Patient presents with  . discuss depression    discuss depression-   She is here with complaints of feeling depressed and not getting as much joy out of her usual activities for the past year. States this is not improving or getting worse. States she has made lifestyle changes and tried counseling but would now like to try medication. 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  Reports constant fatigue, and having a "bitter attitude".  States she was feeling sad because she did not have any help with her child but has found family and friends who are helping her out and giving her time do enjoy more activities but she has not found satisfaction doing these things.  States she is a single mother and her ex husband is not around often.    States she has a good good support network.  Was seeing a counselor for a couple of months. Went through EAP and then tried restoration. States she really is not financially able to continue going to counseling and did not feel like it was helping her any more than talking with her friends.   Changes she has made in the past year has been counseling, exercise- ran a marathon last week, multi vitamins, having quality time for herself.    Denies fever, chills, headache, chest pain, DOE, abdominal pain, GI or GU symptoms.   Reviewed allergies, medications, past medical,  family, and social history.   Depression screen [facility] 2/9 08/10/2016 08/10/2016  Decreased Interest 1 1  Down, Depressed, Hopeless 0 0  PHQ - 2 Score 1 1  Altered sleeping 0 -  Tired, decreased energy 3 -  Change in appetite 1 -  Feeling bad or failure about yourself  1 -  Trouble concentrating 0 -  Moving slowly or fidgety/restless 0 -  Suicidal thoughts 0  -  PHQ-9 Score 6 -     Review of Systems Pertinent positives and negatives in the history of present illness.     Objective:   Physical Exam BP 120/70   Pulse 61   Wt 103 lb 4.8 oz (46.9 kg)   BMI 20.17 kg/m   Alert and oriented and in no acute distress. Not otherwise examined.       Assessment & Plan:  Depression - Plan: sertraline (ZOLOFT) 50 MG tablet  Discussed that I will start her on low dose Sertraline per her request and see how she does with this. Discussed possible side effects and red flags. I also recommend that she continue taking care of herself and doing things that she enjoys. I also encouraged her to think about trying a different therapist but understand that she feels financially strained at this point.  Plan to have her back in 2 weeks how she is doing on the medication.   Spent at least 25 minutes face to face with patient and the majority of the time was in counseling and coordination of care.

## 2016-10-04 ENCOUNTER — Telehealth: Payer: Self-pay

## 2016-10-04 NOTE — Telephone Encounter (Signed)
Pt is going to try to take her lunch tomorrow early but hospital is short staffed so she will call tomorrow morning if she can not come in

## 2016-10-04 NOTE — Telephone Encounter (Signed)
I recommend she comes in for a an appt to discuss medication. I have not seen her since starting the sertraline.

## 2016-10-04 NOTE — Telephone Encounter (Signed)
Pt states that sertraline is not working for her, and would like to try the buproprion Xl 150mg . Please call pt back if you can change her medication. She would like medication called to [facility] on [address]. Pt callback [phone]. Kristy [name]

## 2016-10-05 ENCOUNTER — Encounter: Payer: Self-pay | Admitting: Family Medicine

## 2016-10-05 ENCOUNTER — Ambulatory Visit (INDEPENDENT_AMBULATORY_CARE_PROVIDER_SITE_OTHER): Payer: 59 | Admitting: Family Medicine

## 2016-10-05 VITALS — BP 104/68 | HR 97 | Wt 101.6 lb

## 2016-10-05 DIAGNOSIS — F329 Major depressive disorder, single episode, unspecified: Secondary | ICD-10-CM | POA: Diagnosis not present

## 2016-10-05 DIAGNOSIS — F32A Depression, unspecified: Secondary | ICD-10-CM

## 2016-10-05 MED ORDER — BUPROPION HCL ER (XL) 150 MG PO TB24: ORAL_TABLET | ORAL | 0 refills | Status: DC

## 2016-10-05 NOTE — Patient Instructions (Addendum)
Start taking vitamin D 1,000 IU daily

## 2016-10-05 NOTE — Progress Notes (Signed)
   Subjective:    Patient ID: Kristy [name], female    DOB: 1981-11-02, 35 y.o.   MRN: [id number]  HPI Chief Complaint  Patient presents with  . discuss med    discuss med- other med was causing low blood sugar symptoms    She is following up on depression and new medication. She took the Sertraline approximately 1 1/2 weeks.   States she had side effects that she related to the Sertraline.  Reports that she felt light headed and like she was in a fog. Symptoms started on day one of the medication and went away after she stopped the medication.  She is requesting Wellbutrin. States she has been doing research on this medication. She works in a pharmacy.  She is working on her triggers for depression. States she is scheduling an appointment with a counselor.   She is taking time for herself and doing things she enjoys. Denies SI or HI.  States she does not feel like she gets the usual joy from her activities and is more sensitive to friends and family hurting her feelings. Thinks this is worse in October and November for her.   No fever, chills, N/V/D.    Review of Systems Pertinent positives and negatives in the history of present illness.     Objective:   Physical Exam BP 104/68   Pulse 97   Wt 101 lb 9.6 oz (46.1 kg)   BMI 19.84 kg/m   Alert and oriented and in on acute distress. Not otherwise examined.       Assessment & Plan:  Depression, unspecified depression type  Question if she has seasonal affective disorder vs depression. She is adamant that she feels depressed but states it seems worse in the fall months. She did not like how Sertraline made her feel. Thinks it was causing her to feel like she was in a "fog". Requests Wellbutrin. Discussed taking this in the morning and possible side effects.  She is aware that I would like to hear from her 2 weeks after starting the new medication.  Encouraged her to take good care of herself and go to counseling appointment.    F/u in 1 month before refilling Wellbutrin.

## 2016-10-19 ENCOUNTER — Ambulatory Visit (INDEPENDENT_AMBULATORY_CARE_PROVIDER_SITE_OTHER): Payer: 59 | Admitting: Family Medicine

## 2016-10-19 ENCOUNTER — Encounter: Payer: Self-pay | Admitting: Family Medicine

## 2016-10-19 DIAGNOSIS — F32A Depression, unspecified: Secondary | ICD-10-CM | POA: Insufficient documentation

## 2016-10-19 DIAGNOSIS — F329 Major depressive disorder, single episode, unspecified: Secondary | ICD-10-CM

## 2016-10-19 NOTE — Progress Notes (Signed)
   Subjective:    Patient ID: Kristy [name], female    DOB: 01/30/1981, 35 y.o.   MRN: [id number]  HPI Chief Complaint  Patient presents with  . 2 week follow-up    2 week follow-up on meds- 1 tablet daily- not noticing a difference   She is here to follow up on depression and Wellbutrin. She has been taking 150 mg once daily for the past 2 weeks and has not noticed any side effects but thinks she may feel a little bit improved.  Has not started counseling but plans to.   She is taking Vitamin D 1,000 IU.   Review of Systems Pertinent positives and negatives in the history of present illness.     Objective:   Physical Exam BP 110/60   Pulse 73   Wt 101 lb (45.8 kg)   BMI 19.73 kg/m   Alert and oriented and in no acute distress. Not otherwise examined.      Assessment & Plan:  Depression, unspecified depression type  She appears to be doing fine on her current dose and would like to stay at the 150 mg dose for a couple of more weeks and see if she notices improvement. Encouraged her to start counseling.  She will call in 2 weeks and let me know how she is doing.

## 2016-10-28 ENCOUNTER — Telehealth: Payer: Self-pay | Admitting: Family Medicine

## 2016-10-28 MED ORDER — BUPROPION HCL ER (XL) 150 MG PO TB24: ORAL_TABLET | ORAL | 1 refills | Status: DC

## 2016-10-28 NOTE — Telephone Encounter (Signed)
Pt called and is giving report on her wellbutrin states she has been taking 300 mg per vickie but has not really noticed a difference, she has taking the 300 mg all this week, per vickie she was suppose to call today, she will run out Sunday, she needs a refill she does not have any side effects from the medicine, please advise informed pt that vickie was not in the office until Monday, ,pt uses [place], [place] and pt can be reached at [phone]

## 2016-10-28 NOTE — Telephone Encounter (Signed)
Let her know that I called in for 300 mg per day and have her follow-up with the key in about one month.

## 2016-10-28 NOTE — Telephone Encounter (Signed)
LMTCB. rx for Wellbutrin 300mg  sent to pharmacy.

## 2016-12-12 ENCOUNTER — Telehealth: Payer: Self-pay | Admitting: Family Medicine

## 2016-12-12 DIAGNOSIS — H5213 Myopia, bilateral: Secondary | ICD-10-CM | POA: Diagnosis not present

## 2016-12-12 MED ORDER — BUPROPION HCL ER (XL) 150 MG PO TB24: ORAL_TABLET | ORAL | 5 refills | Status: DC

## 2016-12-12 NOTE — Telephone Encounter (Signed)
Pt says Wellbutrin XL is working well and she needs a refill

## 2016-12-12 NOTE — Telephone Encounter (Signed)
Please refill for her.

## 2016-12-12 NOTE — Telephone Encounter (Signed)
sone

## 2016-12-12 NOTE — Telephone Encounter (Signed)
done

## 2017-02-24 ENCOUNTER — Ambulatory Visit (HOSPITAL_COMMUNITY): Payer: 59

## 2017-08-09 ENCOUNTER — Encounter: Payer: Self-pay | Admitting: Family Medicine

## 2017-08-09 ENCOUNTER — Ambulatory Visit (INDEPENDENT_AMBULATORY_CARE_PROVIDER_SITE_OTHER): Payer: 59 | Admitting: Family Medicine

## 2017-08-09 VITALS — BP 120/70 | HR 72 | Ht 59.75 in | Wt 102.8 lb

## 2017-08-09 DIAGNOSIS — Z0000 Encounter for general adult medical examination without abnormal findings: Secondary | ICD-10-CM | POA: Diagnosis not present

## 2017-08-09 DIAGNOSIS — F32A Depression, unspecified: Secondary | ICD-10-CM

## 2017-08-09 DIAGNOSIS — F329 Major depressive disorder, single episode, unspecified: Secondary | ICD-10-CM | POA: Diagnosis not present

## 2017-08-09 LAB — POCT URINALYSIS DIP (PROADVANTAGE DEVICE)
BILIRUBIN UA: NEGATIVE mg/dL
Bilirubin, UA: NEGATIVE
Glucose, UA: NEGATIVE mg/dL
Leukocytes, UA: NEGATIVE
Nitrite, UA: NEGATIVE
PH UA: 6 (ref 5.0–8.0)
PROTEIN UA: NEGATIVE mg/dL
RBC UA: NEGATIVE
SPECIFIC GRAVITY, URINE: 1.03
Urobilinogen, Ur: NEGATIVE

## 2017-08-09 NOTE — Patient Instructions (Signed)
Call and schedule with an OB/GYN.    Preventative Care for Adults - Female      MAINTAIN REGULAR HEALTH EXAMS:  A routine yearly physical is a good way to check in with your primary care provider about your health and preventive screening. It is also an opportunity to share updates about your health and any concerns you have, and receive a thorough all-over exam.   Most health insurance companies pay for at least some preventative services.  Check with your health plan for specific coverages.  WHAT PREVENTATIVE SERVICES DO WOMEN NEED?  Adult women should have their weight and blood pressure checked regularly.   Women age 42 and older should have their cholesterol levels checked regularly.  Women should be screened for cervical cancer with a Pap smear and pelvic exam beginning at either age 37, or 3 years after they become sexually activity.    Breast cancer screening generally begins at age 90 with a mammogram and breast exam by your primary care provider.    Beginning at age 21 and continuing to age 4, women should be screened for colorectal cancer.  Certain people may need continued testing until age 14.  Updating vaccinations is part of preventative care.  Vaccinations help protect against diseases such as the flu.  Osteoporosis is a disease in which the bones lose minerals and strength as we age. Women ages 57 and over should discuss this with their caregivers, as should women after menopause who have other risk factors.  Lab tests are generally done as part of preventative care to screen for anemia and blood disorders, to screen for problems with the kidneys and liver, to screen for bladder problems, to check blood sugar, and to check your cholesterol level.  Preventative services generally include counseling about diet, exercise, avoiding tobacco, drugs, excessive alcohol consumption, and sexually transmitted infections.    GENERAL RECOMMENDATIONS FOR GOOD HEALTH:  Healthy  diet:  Eat a variety of foods, including fruit, vegetables, animal or vegetable protein, such as meat, fish, chicken, and eggs, or beans, lentils, tofu, and grains, such as rice.  Drink plenty of water daily.  Decrease saturated fat in the diet, avoid lots of red meat, processed foods, sweets, fast foods, and fried foods.  Exercise:  Aerobic exercise helps maintain good heart health. At least 30-40 minutes of moderate-intensity exercise is recommended. For example, a brisk walk that increases your heart rate and breathing. This should be done on most days of the week.   Find a type of exercise or a variety of exercises that you enjoy so that it becomes a part of your daily life.  Examples are running, walking, swimming, water aerobics, and biking.  For motivation and support, explore group exercise such as aerobic class, spin class, Zumba, Yoga,or  martial arts, etc.    Set exercise goals for yourself, such as a certain weight goal, walk or run in a race such as a 5k walk/run.  Speak to your primary care provider about exercise goals.  Disease prevention:  If you smoke or chew tobacco, find out from your caregiver how to quit. It can literally save your life, no matter how long you have been a tobacco user. If you do not use tobacco, never begin.   Maintain a healthy diet and normal weight. Increased weight leads to problems with blood pressure and diabetes.   The Body Mass Index or BMI is a way of measuring how much of your body is fat. Having a  BMI above 27 increases the risk of heart disease, diabetes, hypertension, stroke and other problems related to obesity. Your caregiver can help determine your BMI and based on it develop an exercise and dietary program to help you achieve or maintain this important measurement at a healthful level.  High blood pressure causes heart and blood vessel problems.  Persistent high blood pressure should be treated with medicine if weight loss and exercise  do not work.   Fat and cholesterol leaves deposits in your arteries that can block them. This causes heart disease and vessel disease elsewhere in your body.  If your cholesterol is found to be high, or if you have heart disease or certain other medical conditions, then you may need to have your cholesterol monitored frequently and be treated with medication.   Ask if you should have a cardiac stress test if your history suggests this. A stress test is a test done on a treadmill that looks for heart disease. This test can find disease prior to there being a problem.  Menopause can be associated with physical symptoms and risks. Hormone replacement therapy is available to decrease these. You should talk to your caregiver about whether starting or continuing to take hormones is right for you.   Osteoporosis is a disease in which the bones lose minerals and strength as we age. This can result in serious bone fractures. Risk of osteoporosis can be identified using a bone density scan. Women ages 64 and over should discuss this with their caregivers, as should women after menopause who have other risk factors. Ask your caregiver whether you should be taking a calcium supplement and Vitamin D, to reduce the rate of osteoporosis.   Avoid drinking alcohol in excess (more than two drinks per day).  Avoid use of street drugs. Do not share needles with anyone. Ask for professional help if you need assistance or instructions on stopping the use of alcohol, cigarettes, and/or drugs.  Brush your teeth twice a day with fluoride toothpaste, and floss once a day. Good oral hygiene prevents tooth decay and gum disease. The problems can be painful, unattractive, and can cause other health problems. Visit your dentist for a routine oral and dental check up and preventive care every 6-12 months.   Look at your skin regularly.  Use a mirror to look at your back. Notify your caregivers of changes in moles, especially if  there are changes in shapes, colors, a size larger than a pencil eraser, an irregular border, or development of new moles.  Safety:  Use seatbelts 100% of the time, whether driving or as a passenger.  Use safety devices such as hearing protection if you work in environments with loud noise or significant background noise.  Use safety glasses when doing any work that could send debris in to the eyes.  Use a helmet if you ride a bike or motorcycle.  Use appropriate safety gear for contact sports.  Talk to your caregiver about gun safety.  Use sunscreen with a SPF (or skin protection factor) of 15 or greater.  Lighter skinned people are at a greater risk of skin cancer. Don't forget to also wear sunglasses in order to protect your eyes from too much damaging sunlight. Damaging sunlight can accelerate cataract formation.   Practice safe sex. Use condoms. Condoms are used for birth control and to help reduce the spread of sexually transmitted infections (or STIs).  Some of the STIs are gonorrhea (the clap), chlamydia, syphilis, trichomonas, herpes,  HPV (human papilloma virus) and HIV (human immunodeficiency virus) which causes AIDS. The herpes, HIV and HPV are viral illnesses that have no cure. These can result in disability, cancer and death.   Keep carbon monoxide and smoke detectors in your home functioning at all times. Change the batteries every 6 months or use a model that plugs into the wall.   Vaccinations:  Stay up to date with your tetanus shots and other required immunizations. You should have a booster for tetanus every 10 years. Be sure to get your flu shot every year, since 5%-20% of the [place] population comes down with the flu. The flu vaccine changes each year, so being vaccinated once is not enough. Get your shot in the fall, before the flu season peaks.   Other vaccines to consider:  Human Papilloma Virus or HPV causes cancer of the cervix, and other infections that can be transmitted  from person to person. There is a vaccine for HPV, and females should get immunized between the ages of 35 and 57. It requires a series of 3 shots.   Pneumococcal vaccine to protect against certain types of pneumonia.  This is normally recommended for adults age 38 or older.  However, adults younger than 36 years old with certain underlying conditions such as diabetes, heart or lung disease should also receive the vaccine.  Shingles vaccine to protect against Varicella Zoster if you are older than age 26, or younger than 36 years old with certain underlying illness.  Hepatitis A vaccine to protect against a form of infection of the liver by a virus acquired from food.  Hepatitis B vaccine to protect against a form of infection of the liver by a virus acquired from blood or body fluids, particularly if you work in health care.  If you plan to travel internationally, check with your local health department for specific vaccination recommendations.  Cancer Screening:  Breast cancer screening is essential to preventive care for women. All women age 32 and older should perform a breast self-exam every month. At age 17 and older, women should have their caregiver complete a breast exam each year. Women at ages 29 and older should have a mammogram (x-ray film) of the breasts. Your caregiver can discuss how often you need mammograms.    Cervical cancer screening includes taking a Pap smear (sample of cells examined under a microscope) from the cervix (end of the uterus). It also includes testing for HPV (Human Papilloma Virus, which can cause cervical cancer). Screening and a pelvic exam should begin at age 81, or 3 years after a woman becomes sexually active. Screening should occur every year, with a Pap smear but no HPV testing, up to age 28. After age 26, you should have a Pap smear every 3 years with HPV testing, if no HPV was found previously.   Most routine colon cancer screening begins at the age of  22. On a yearly basis, doctors may provide special easy to use take-home tests to check for hidden blood in the stool. Sigmoidoscopy or colonoscopy can detect the earliest forms of colon cancer and is life saving. These tests use a small camera at the end of a tube to directly examine the colon. Speak to your caregiver about this at age 39, when routine screening begins (and is repeated every 5 years unless early forms of pre-cancerous polyps or small growths are found).

## 2017-08-09 NOTE — Progress Notes (Signed)
Subjective:    Patient ID: Kristy [name], female    DOB: 13-Aug-1981, 36 y.o.   MRN: [id number]  HPI Chief Complaint  Patient presents with  . not fasting    not fasting cpe, sees obgyn- [facility],    She is new to the practice and here for a CPE. She is not fasting. No concerns today.   Other providers:  [facility]   History of depression and is doing well on current medication. No issues with this. Takes it seasonally.   Social history: Lives with daughter who is 59 years old and a Restaurant manager, fast food Denies smoking, drinking alcohol, drug use  Diet: healthy diet  Excerise: very active   Immunizations: Tdap up to date.   Health maintenance:  Mammogram: June 2017  Colonoscopy: N/A Last Gynecological Exam: 2 years ago and normal.  Last Menstrual cycle: 07/18/2017. regular every 25 days.  Last Dental Exam: August 2018 Last Eye Exam: April 2018   Depression screen [facility] 2/9 08/09/2017 08/10/2016 08/10/2016  Decreased Interest 0 1 1  Down, Depressed, Hopeless 0 0 0  PHQ - 2 Score 0 1 1  Altered sleeping - 0 -  Tired, decreased energy - 3 -  Change in appetite - 1 -  Feeling bad or failure about yourself  - 1 -  Trouble concentrating - 0 -  Moving slowly or fidgety/restless - 0 -  Suicidal thoughts - 0 -  PHQ-9 Score - 6 -    Wears seatbelt always, uses sunscreen, smoke detectors in home and functioning, does not text while driving and feels safe in home environment.   Reviewed allergies, medications, past medical, surgical, family, and social history.   Review of Systems Review of Systems Constitutional: -fever, -chills, -sweats, -unexpected weight change,-fatigue ENT: -runny nose, -ear pain, -sore throat Cardiology:  -chest pain, -palpitations, -edema Respiratory: -cough, -shortness of breath, -wheezing Gastroenterology: -abdominal pain, -nausea, -vomiting, -diarrhea, -constipation  Hematology: -bleeding or bruising problems Musculoskeletal: -arthralgias,  -myalgias, -joint swelling, -back pain Ophthalmology: -vision changes Urology: -dysuria, -difficulty urinating, -hematuria, -urinary frequency, -urgency Neurology: -headache, -weakness, -tingling, -numbness       Objective:   Physical Exam BP 120/70   Pulse 72   Ht 4' 11.75" (1.518 m)   Wt 102 lb 12.8 oz (46.6 kg)   LMP 07/18/2017   BMI 20.25 kg/m   General Appearance:    Alert, cooperative, no distress, appears stated age  Head:    Normocephalic, without obvious abnormality, atraumatic  Eyes:    PERRL, conjunctiva/corneas clear, EOM's intact, fundi    benign  Ears:    Normal TM's and external ear canals  Nose:   Nares normal, mucosa normal, no drainage or sinus   tenderness  Throat:   Lips, mucosa, and tongue normal; teeth and gums normal  Neck:   Supple, no lymphadenopathy;  thyroid:  no   enlargement/tenderness/nodules; no carotid   bruit or JVD  Back:    Spine nontender, no curvature, ROM normal, no CVA     tenderness  Lungs:     Clear to auscultation bilaterally without wheezes, rales or     ronchi; respirations unlabored  Chest Wall:    No tenderness or deformity   Heart:    Regular rate and rhythm, S1 and S2 normal, no murmur, rub   or gallop  Breast Exam:    Done at OB/GYN   Abdomen:     Soft, non-tender, nondistended, normoactive bowel sounds,    no masses,  no hepatosplenomegaly  Genitalia:    Done at OB/GYN  Rectal:    Not performed due to age<40 and no related complaints  Extremities:   No clubbing, cyanosis or edema  Pulses:   2+ and symmetric all extremities  Skin:   Skin color, texture, turgor normal, no rashes or lesions  Lymph nodes:   Cervical, supraclavicular, and axillary nodes normal  Neurologic:   CNII-XII intact, normal strength, sensation and gait; reflexes 2+ and symmetric throughout          Psych:   Normal mood, affect, hygiene and grooming.     Urinalysis dipstick: negative       Assessment & Plan:  Routine general medical examination at a  health care facility - Plan: POCT Urinalysis DIP (Proadvantage Device), CBC with Differential/Platelet, Comprehensive metabolic panel  Depression, unspecified depression type  She is doing well. She takes Wellbutrin during the fall and winter months and started back on this a couple of weeks ago. This seems to be controlling her depressive symptoms.  She is up to date on health maintenance and immunizations. Plans to get a new OB/GYN who is in her insurance network.  Counseled on healthy lifestyle.  She would like to return for labs later this week. Orders are in the system.  Follow up as needed.

## 2017-08-18 ENCOUNTER — Other Ambulatory Visit: Payer: 59

## 2017-08-21 ENCOUNTER — Other Ambulatory Visit: Payer: 59

## 2017-08-23 ENCOUNTER — Telehealth: Payer: 59 | Admitting: Nurse Practitioner

## 2017-08-23 DIAGNOSIS — N390 Urinary tract infection, site not specified: Secondary | ICD-10-CM

## 2017-08-23 MED ORDER — NITROFURANTOIN MONOHYD MACRO 100 MG PO CAPS: 100.0000 mg | ORAL_CAPSULE | Freq: Two times a day (BID) | ORAL | 0 refills | Status: AC

## 2017-08-23 NOTE — Progress Notes (Signed)

## 2017-08-24 ENCOUNTER — Other Ambulatory Visit: Payer: 59

## 2017-08-24 ENCOUNTER — Telehealth: Payer: Self-pay | Admitting: Family Medicine

## 2017-08-24 DIAGNOSIS — Z0000 Encounter for general adult medical examination without abnormal findings: Secondary | ICD-10-CM | POA: Diagnosis not present

## 2017-08-24 LAB — CBC WITH DIFFERENTIAL/PLATELET
BASOS ABS: 29 {cells}/uL (ref 0–200)
Basophils Relative: 0.3 %
EOS ABS: 190 {cells}/uL (ref 15–500)
Eosinophils Relative: 2 %
HCT: 36.9 % (ref 35.0–45.0)
Hemoglobin: 12.5 g/dL (ref 11.7–15.5)
Lymphs Abs: 1254 cells/uL (ref 850–3900)
MCH: 30.5 pg (ref 27.0–33.0)
MCHC: 33.9 g/dL (ref 32.0–36.0)
MCV: 90 fL (ref 80.0–100.0)
MONOS PCT: 5.2 %
MPV: 10.2 fL (ref 7.5–12.5)
Neutro Abs: 7534 cells/uL (ref 1500–7800)
Neutrophils Relative %: 79.3 %
PLATELETS: 256 10*3/uL (ref 140–400)
RBC: 4.1 10*6/uL (ref 3.80–5.10)
RDW: 12.2 % (ref 11.0–15.0)
TOTAL LYMPHOCYTE: 13.2 %
WBC mixed population: 494 cells/uL (ref 200–950)
WBC: 9.5 10*3/uL (ref 3.8–10.8)

## 2017-08-24 LAB — COMPREHENSIVE METABOLIC PANEL
AG RATIO: 1.4 (calc) (ref 1.0–2.5)
ALT: 9 U/L (ref 6–29)
AST: 13 U/L (ref 10–30)
Albumin: 4.1 g/dL (ref 3.6–5.1)
Alkaline phosphatase (APISO): 49 U/L (ref 33–115)
BUN: 10 mg/dL (ref 7–25)
CHLORIDE: 103 mmol/L (ref 98–110)
CO2: 28 mmol/L (ref 20–32)
Calcium: 9.2 mg/dL (ref 8.6–10.2)
Creat: 0.8 mg/dL (ref 0.50–1.10)
GLUCOSE: 87 mg/dL (ref 65–99)
Globulin: 2.9 g/dL (calc) (ref 1.9–3.7)
Potassium: 4.3 mmol/L (ref 3.5–5.3)
Sodium: 137 mmol/L (ref 135–146)
Total Bilirubin: 0.5 mg/dL (ref 0.2–1.2)
Total Protein: 7 g/dL (ref 6.1–8.1)

## 2017-08-24 NOTE — Telephone Encounter (Signed)
Received requested pap from [facility]. Sending back for review.  °

## 2017-08-28 ENCOUNTER — Other Ambulatory Visit: Payer: Self-pay | Admitting: Family Medicine

## 2017-08-28 ENCOUNTER — Encounter: Payer: Self-pay | Admitting: Family Medicine

## 2017-11-01 DIAGNOSIS — Z01419 Encounter for gynecological examination (general) (routine) without abnormal findings: Secondary | ICD-10-CM | POA: Diagnosis not present

## 2017-11-01 DIAGNOSIS — Z681 Body mass index (BMI) 19 or less, adult: Secondary | ICD-10-CM | POA: Diagnosis not present

## 2017-12-23 ENCOUNTER — Other Ambulatory Visit: Payer: Self-pay | Admitting: Family Medicine

## 2017-12-25 NOTE — Telephone Encounter (Signed)
ok 

## 2017-12-25 NOTE — Telephone Encounter (Signed)
Is this okay to refill? 

## 2018-02-01 DIAGNOSIS — H52223 Regular astigmatism, bilateral: Secondary | ICD-10-CM | POA: Diagnosis not present

## 2018-09-11 ENCOUNTER — Encounter: Payer: Self-pay | Admitting: Family Medicine

## 2018-09-11 ENCOUNTER — Ambulatory Visit (INDEPENDENT_AMBULATORY_CARE_PROVIDER_SITE_OTHER): Payer: 59 | Admitting: Family Medicine

## 2018-09-11 VITALS — BP 102/74 | HR 62 | Temp 97.6°F | Wt 100.6 lb

## 2018-09-11 DIAGNOSIS — L03116 Cellulitis of left lower limb: Secondary | ICD-10-CM | POA: Diagnosis not present

## 2018-09-11 MED ORDER — DOXYCYCLINE HYCLATE 100 MG PO TABS: 100.0000 mg | ORAL_TABLET | Freq: Two times a day (BID) | ORAL | 0 refills | Status: DC

## 2018-09-11 MED FILL — DOXYCYCLINE HYCLATE 100 MG: 100 | 7 days supply | Qty: 14 | Fill #0

## 2018-09-11 NOTE — Progress Notes (Signed)
   Subjective:    Patient ID: Kristy [name], female    DOB: 13-Oct-1981, 37 y.o.   MRN: [id number]  HPI Sustained an injury to the left knee over the weekend and did  clean it up herself.  Yesterday she did stated it was more red and came to ahead.  She expressed some purulent material but also had debris in it.  She states that today the area is less swollen.   Review of Systems     Objective:   Physical Exam Distal to the left knee anteriorly is a small area of healing wound with surrounding erythema of approximately 4 cm.  The joint itself is totally normal.  The area of erythema was marked with an ink pen       Assessment & Plan:  Cellulitis of left lower extremity - Plan: doxycycline (VIBRA-TABS) 100 MG tablet She is to keep the area clean and dry.  Use the antibiotic and if the area of erythema gets worse she will call.

## 2018-09-29 ENCOUNTER — Telehealth: Payer: 59 | Admitting: Family

## 2018-09-29 DIAGNOSIS — H01005 Unspecified blepharitis left lower eyelid: Secondary | ICD-10-CM

## 2018-09-29 MED ORDER — POLYMYXIN B-TRIMETHOPRIM 10000-0.1 UNIT/ML-% OP SOLN: 2.0000 [drp] | Freq: Four times a day (QID) | OPHTHALMIC | 0 refills | Status: DC

## 2018-09-29 NOTE — Progress Notes (Signed)
Thank you for the details you included in the comment boxes. Those details are very helpful in determining the best course of treatment for you and help [place] to provide the best care.  We are sorry that you are not feeling well. Here is how we plan to help!  Based on what you have shared with me it looks like you have a stye.  A stye is an inflammation of the eyelid.  It is often a red, painful lump near the edge of the eyelid that may look like a boil or a pimple.  A stye develops when an infection occurs at the base of an eyelash.   We have made appropriate suggestions for you based upon your presentation: Your symptoms may indicate an infection of the sclera.  The use of anti-inflammatory and antibiotic eye drops for a week will help resolve this condition.  I have sent in polytrim opthalmic suspension (without steroids), two drops in the affected eye every 6 hours.  If your symptoms do not improve over the next two to three days you should be seen in your doctor's office.  HOME CARE:   Wash your hands often!  Let the stye open on its own. Don't squeeze or open it.  Don't rub your eyes. This can irritate your eyes and let in bacteria.  If you need to touch your eyes, wash your hands first.  Don't wear eye makeup or contact lenses until the area has healed.  GET HELP RIGHT AWAY IF:   Your symptoms do not improve.  You develop blurred or loss of vision.  Your symptoms worsen (increased discharge, pain or redness).  Thank you for choosing an e-visit.  Your e-visit answers were reviewed by a board certified advanced clinical practitioner to complete your personal care plan.  Depending upon the condition, your plan could have included both over the counter or prescription medications.  Please review your pharmacy choice.  Make sure the pharmacy is open so you can pick up prescription now.  If there is a problem, you may contact your provider through [employer] and have the  prescription routed to another pharmacy.    Your safety is important to [place].  If you have drug allergies check your prescription carefully.  For the next 24 hours you can use MyChart to ask questions about today's visit, request a non-urgent call back, or ask for a work or school excuse.  You will get an email in the next two days asking about your experience.  I hope you that your e-visit has been valuable and will speed your recovery.

## 2018-10-12 ENCOUNTER — Other Ambulatory Visit: Payer: Self-pay | Admitting: Family Medicine

## 2018-10-12 DIAGNOSIS — L03116 Cellulitis of left lower limb: Secondary | ICD-10-CM

## 2018-10-15 MED FILL — DOXYCYCLINE HYCLATE 100 MG: 100 | 7 days supply | Qty: 14 | Fill #0

## 2018-10-15 NOTE — Telephone Encounter (Signed)
Can you get more details? Looks like Dr. Redmond School saw her for this problem.

## 2018-10-15 NOTE — Telephone Encounter (Signed)
Left message for pt to call me back 

## 2018-10-15 NOTE — Telephone Encounter (Signed)
Ok to refill but if she is not back to baseline after completing the antibiotic she should be seen. Please let her know that this is not safe to take in pregnancy just in case.

## 2018-10-15 NOTE — Telephone Encounter (Signed)
patient did not take medication properly (only took for 5 days) and asking for a refill to retake for the full 10-day therapy. No fever, still red and she drained it herself with sterile stuff. Please advise

## 2018-10-15 NOTE — Telephone Encounter (Signed)
Kristy [name] is requesting to fill pt doxycycline due to pt not taking med correctly. Please advise. Kristy [name]

## 2018-12-18 DIAGNOSIS — H5213 Myopia, bilateral: Secondary | ICD-10-CM | POA: Diagnosis not present

## 2018-12-18 MED FILL — NEO/POLYMYXIN/DEXAMETH DROP: 3.5-10000-0 | 13 days supply | Qty: 5 | Fill #0

## 2018-12-25 NOTE — Progress Notes (Signed)
Subjective:    Patient ID: Kristy [name], female    DOB: [birth year], 38 y.o.   MRN: [id number]  HPI Chief Complaint  Patient presents with  . cpe    cpe-fasting   She is here for a complete physical exam. Reports doing well.   Other providers: Dr. Luretha Rued- eyes Dr. Pamala Hurry - OB/GYN   Left eye with a staph infection. She is currently being treated with topical and oral antibiotics by her eye doctor and reports symptoms have improved significantly.  Depression- change in job and doing well without medication. She typically takes it in the fall and stops in December. Thinks she has a seasonal issue.   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  Denies smoking, drinking alcohol, drug use  Diet: healthy, eating vegetarian  Excerise: 3-4 days per week   Immunizations: UTD   Health maintenance:  Mammogram: June 2017  Colonoscopy: never  Last Gynecological Exam: next week will be follow up  Last Menstrual cycle: yesterday   Last Dental Exam: twice annually  Last Eye Exam: in the past month   Wears seatbelt always, uses sunscreen, smoke detectors in home and functioning, does not text while driving and feels safe in home environment.   Reviewed allergies, medications, past medical, surgical, family, and social history.   Review of Systems Review of Systems Constitutional: -fever, -chills, -sweats, -unexpected weight change,-fatigue ENT: -runny nose, -ear pain, -sore throat Cardiology:  -chest pain, -palpitations, -edema Respiratory: -cough, -shortness of breath, -wheezing Gastroenterology: -abdominal pain, -nausea, -vomiting, -diarrhea, -constipation  Hematology: -bleeding or bruising problems Musculoskeletal: -arthralgias, -myalgias, -joint swelling, -back pain Ophthalmology: -vision changes Urology: -dysuria, -difficulty urinating, -hematuria, -urinary frequency, -urgency Neurology: -headache, -weakness, -tingling,  -numbness       Objective:   Physical Exam BP 110/60   Pulse 69   Ht 4' 11.75" (1.518 m)   Wt 102 lb 3.2 oz (46.4 kg)   LMP 12/25/2018   BMI 20.13 kg/m   General Appearance:    Alert, cooperative, no distress, appears stated age  Head:    Normocephalic, without obvious abnormality, atraumatic  Eyes:    PERRL, conjunctiva/corneas clear, EOM's intact, fundi    benign  Ears:    Normal TM's and external ear canals  Nose:   Nares normal, mucosa normal, no drainage or sinus   tenderness  Throat:   Lips, mucosa, and tongue normal; teeth and gums normal  Neck:   Supple, no lymphadenopathy;  thyroid:  no   enlargement/tenderness/nodules; no carotid   bruit or JVD  Back:    Spine nontender, no curvature, ROM normal, no CVA     tenderness  Lungs:     Clear to auscultation bilaterally without wheezes, rales or     ronchi; respirations unlabored  Chest Wall:    No tenderness or deformity   Heart:    Regular rate and rhythm, S1 and S2 normal, no murmur, rub   or gallop  Breast Exam:    OB/GYN   Abdomen:     Soft, non-tender, nondistended, normoactive bowel sounds,    no masses, no hepatosplenomegaly  Genitalia:    OB/GYN  Rectal:    Not performed due to age<40 and no related complaints  Extremities:   No clubbing, cyanosis or edema  Pulses:   2+ and symmetric all extremities  Skin:   Skin color, texture, turgor normal, no rashes or lesions  Lymph nodes:   Cervical, supraclavicular,  and axillary nodes normal  Neurologic:   CNII-XII intact, normal strength, sensation and gait; reflexes 2+ and symmetric throughout          Psych:   Normal mood, affect, hygiene and grooming.     Urinalysis dipstick: unable to void       Assessment & Plan:  Routine general medical examination at a health care facility - Plan: CBC with Differential/Platelet, Comprehensive metabolic panel, TSH, T4, free, Lipid panel  Depression, unspecified depression type  History of gestational diabetes - Plan:  Hemoglobin A1c  Screening for lipid disorders - Plan: Lipid panel  She appears to be doing well physically and emotionally.  She is very excited about her new job.  Reports having less stress.  She is not currently taking an antidepressant and states she does not need this.  Her mood is good.  Historically she has been on an SSRI in the fall and generally stops in December. History of gestational diabetes and we will recheck her hemoglobin A1c. Immunizations are up-to-date as well as health maintenance.  She will see her OB/GYN next week. Counseling on healthy diet and exercise. Discussed safety. Follow-up pending labs.

## 2018-12-26 ENCOUNTER — Ambulatory Visit (INDEPENDENT_AMBULATORY_CARE_PROVIDER_SITE_OTHER): Payer: 59 | Admitting: Family Medicine

## 2018-12-26 ENCOUNTER — Encounter: Payer: Self-pay | Admitting: Family Medicine

## 2018-12-26 VITALS — BP 110/60 | HR 69 | Ht 59.75 in | Wt 102.2 lb

## 2018-12-26 DIAGNOSIS — Z8632 Personal history of gestational diabetes: Secondary | ICD-10-CM

## 2018-12-26 DIAGNOSIS — Z13220 Encounter for screening for lipoid disorders: Secondary | ICD-10-CM

## 2018-12-26 DIAGNOSIS — F329 Major depressive disorder, single episode, unspecified: Secondary | ICD-10-CM

## 2018-12-26 DIAGNOSIS — Z0000 Encounter for general adult medical examination without abnormal findings: Secondary | ICD-10-CM

## 2018-12-26 DIAGNOSIS — F32A Depression, unspecified: Secondary | ICD-10-CM

## 2018-12-26 NOTE — Patient Instructions (Signed)
Preventive Care 18-39 Years, Female Preventive care refers to lifestyle choices and visits with your health care provider that can promote health and wellness. What does preventive care include?   A yearly physical exam. This is also called an annual well check.  Dental exams once or twice a year.  Routine eye exams. Ask your health care provider how often you should have your eyes checked.  Personal lifestyle choices, including: ? Daily care of your teeth and gums. ? Regular physical activity. ? Eating a healthy diet. ? Avoiding tobacco and drug use. ? Limiting alcohol use. ? Practicing safe sex. ? Taking vitamin and mineral supplements as recommended by your health care provider. What happens during an annual well check? The services and screenings done by your health care provider during your annual well check will depend on your age, overall health, lifestyle risk factors, and family history of disease. Counseling Your health care provider may ask you questions about your:  Alcohol use.  Tobacco use.  Drug use.  Emotional well-being.  Home and relationship well-being.  Sexual activity.  Eating habits.  Work and work environment.  Method of birth control.  Menstrual cycle.  Pregnancy history. Screening You may have the following tests or measurements:  Height, weight, and BMI.  Diabetes screening. This is done by checking your blood sugar (glucose) after you have not eaten for a while (fasting).  Blood pressure.  Lipid and cholesterol levels. These may be checked every 5 years starting at age 20.  Skin check.  Hepatitis C blood test.  Hepatitis B blood test.  Sexually transmitted disease (STD) testing.  BRCA-related cancer screening. This may be done if you have a family history of breast, ovarian, tubal, or peritoneal cancers.  Pelvic exam and Pap test. This may be done every 3 years starting at age 21. Starting at age 30, this may be done every 5  years if you have a Pap test in combination with an HPV test. Discuss your test results, treatment options, and if necessary, the need for more tests with your health care provider. Vaccines Your health care provider may recommend certain vaccines, such as:  Influenza vaccine. This is recommended every year.  Tetanus, diphtheria, and acellular pertussis (Tdap, Td) vaccine. You may need a Td booster every 10 years.  Varicella vaccine. You may need this if you have not been vaccinated.  HPV vaccine. If you are 26 or younger, you may need three doses over 6 months.  Measles, mumps, and rubella (MMR) vaccine. You may need at least one dose of MMR. You may also need a second dose.  Pneumococcal 13-valent conjugate (PCV13) vaccine. You may need this if you have certain conditions and were not previously vaccinated.  Pneumococcal polysaccharide (PPSV23) vaccine. You may need one or two doses if you smoke cigarettes or if you have certain conditions.  Meningococcal vaccine. One dose is recommended if you are age 19-21 years and a first-year college student living in a residence hall, or if you have one of several medical conditions. You may also need additional booster doses.  Hepatitis A vaccine. You may need this if you have certain conditions or if you travel or work in places where you may be exposed to hepatitis A.  Hepatitis B vaccine. You may need this if you have certain conditions or if you travel or work in places where you may be exposed to hepatitis B.  Haemophilus influenzae type b (Hib) vaccine. You may need this if you   have certain risk factors. Talk to your health care provider about which screenings and vaccines you need and how often you need them. This information is not intended to replace advice given to you by your health care provider. Make sure you discuss any questions you have with your health care provider. Document Released: 12/27/2001 Document Revised: 06/13/2017  Document Reviewed: 09/01/2015 Elsevier Interactive Patient Education  2019 [employer].

## 2018-12-27 LAB — COMPREHENSIVE METABOLIC PANEL
ALT: 11 IU/L (ref 0–32)
AST: 21 IU/L (ref 0–40)
Albumin/Globulin Ratio: 1.7 (ref 1.2–2.2)
Albumin: 4.6 g/dL (ref 3.8–4.8)
Alkaline Phosphatase: 60 IU/L (ref 39–117)
BILIRUBIN TOTAL: 0.4 mg/dL (ref 0.0–1.2)
BUN/Creatinine Ratio: 12 (ref 9–23)
BUN: 9 mg/dL (ref 6–20)
CHLORIDE: 104 mmol/L (ref 96–106)
CO2: 20 mmol/L (ref 20–29)
Calcium: 9.4 mg/dL (ref 8.7–10.2)
Creatinine, Ser: 0.77 mg/dL (ref 0.57–1.00)
GFR calc non Af Amer: 99 mL/min/{1.73_m2} (ref >59)
GFR, EST AFRICAN AMERICAN: 114 mL/min/{1.73_m2} (ref >59)
GLUCOSE: 83 mg/dL (ref 65–99)
Globulin, Total: 2.7 g/dL (ref 1.5–4.5)
POTASSIUM: 4.7 mmol/L (ref 3.5–5.2)
SODIUM: 140 mmol/L (ref 134–144)
TOTAL PROTEIN: 7.3 g/dL (ref 6.0–8.5)

## 2018-12-27 LAB — LIPID PANEL
Chol/HDL Ratio: 2 ratio (ref 0.0–4.4)
Cholesterol, Total: 155 mg/dL (ref 100–199)
HDL: 78 mg/dL (ref >39)
LDL Calculated: 65 mg/dL (ref 0–99)
Triglycerides: 59 mg/dL (ref 0–149)
VLDL Cholesterol Cal: 12 mg/dL (ref 5–40)

## 2018-12-27 LAB — CBC WITH DIFFERENTIAL/PLATELET
BASOS ABS: 0 10*3/uL (ref 0.0–0.2)
Basos: 0 %
EOS (ABSOLUTE): 0.1 10*3/uL (ref 0.0–0.4)
Eos: 3 %
Hematocrit: 37.8 % (ref 34.0–46.6)
Hemoglobin: 13.2 g/dL (ref 11.1–15.9)
Immature Grans (Abs): 0 10*3/uL (ref 0.0–0.1)
Immature Granulocytes: 0 %
Lymphocytes Absolute: 1.4 10*3/uL (ref 0.7–3.1)
Lymphs: 28 %
MCH: 31.1 pg (ref 26.6–33.0)
MCHC: 34.9 g/dL (ref 31.5–35.7)
MCV: 89 fL (ref 79–97)
MONOS ABS: 0.2 10*3/uL (ref 0.1–0.9)
Monocytes: 5 %
NEUTROS ABS: 3.1 10*3/uL (ref 1.4–7.0)
NEUTROS PCT: 64 %
PLATELETS: 307 10*3/uL (ref 150–450)
RBC: 4.24 x10E6/uL (ref 3.77–5.28)
RDW: 12.1 % (ref 11.7–15.4)
WBC: 4.9 10*3/uL (ref 3.4–10.8)

## 2018-12-27 LAB — T4, FREE: FREE T4: 1.22 ng/dL (ref 0.82–1.77)

## 2018-12-27 LAB — TSH: TSH: 3.37 u[IU]/mL (ref 0.450–4.500)

## 2018-12-27 LAB — HEMOGLOBIN A1C
Est. average glucose Bld gHb Est-mCnc: 103 mg/dL
Hgb A1c MFr Bld: 5.2 % (ref 4.8–5.6)

## 2019-01-01 DIAGNOSIS — Z6820 Body mass index (BMI) 20.0-20.9, adult: Secondary | ICD-10-CM | POA: Diagnosis not present

## 2019-01-01 DIAGNOSIS — Z01419 Encounter for gynecological examination (general) (routine) without abnormal findings: Secondary | ICD-10-CM | POA: Diagnosis not present

## 2019-01-01 DIAGNOSIS — Z1151 Encounter for screening for human papillomavirus (HPV): Secondary | ICD-10-CM | POA: Diagnosis not present

## 2019-01-02 MED FILL — VALACYCLOVIR HCL 500 MG TAB: 500 | 10 days supply | Qty: 30 | Fill #0

## 2019-02-16 ENCOUNTER — Telehealth: Payer: 59 | Admitting: Gastroenterology

## 2019-02-16 DIAGNOSIS — R21 Rash and other nonspecific skin eruption: Secondary | ICD-10-CM

## 2019-02-16 MED ORDER — DESONIDE 0.05 % EX CREA: TOPICAL_CREAM | CUTANEOUS | 0 refills | Status: DC

## 2019-02-16 NOTE — Progress Notes (Signed)
E Visit for Rash  We are sorry that you are not feeling well. Here is how we plan to help!  Based on what you shared with me it looks like you have contact dermatitis.  Contact dermatitis is a skin rash caused by something that touches the skin and causes irritation or inflammation.  Your skin may be red, swollen, dry, cracked, and itch.  The rash should go away in a few days but can last a few weeks.  If you get a rash, it's important to figure out what caused it so the irritant can be avoided in the future.  I have sent in a low potency steroid that you can use short term on the face and around the eye but avoid contact with the eye. Desonide cream apply twice a day for one to two weeks.   HOME CARE:   Take cool showers and avoid direct sunlight.  Apply cool compress or wet dressings.  Take a bath in an oatmeal bath.  Sprinkle content of one Aveeno packet under running faucet with comfortably warm water.  Bathe for 15-20 minutes, 1-2 times daily.  Pat dry with a towel. Do not rub the rash.  Use hydrocortisone cream.  Take an antihistamine like Benadryl for widespread rashes that itch.  The adult dose of Benadryl is 25-50 mg by mouth 4 times daily.  Caution:  This type of medication may cause sleepiness.  Do not drink alcohol, drive, or operate dangerous machinery while taking antihistamines.  Do not take these medications if you have prostate enlargement.  Read package instructions thoroughly on all medications that you take.  GET HELP RIGHT AWAY IF:   Symptoms don't go away after treatment.  Severe itching that persists.  If you rash spreads or swells.  If you rash begins to smell.  If it blisters and opens or develops a yellow-brown crust.  You develop a fever.  You have a sore throat.  You become short of breath.  MAKE SURE YOU:  Understand these instructions. Will watch your condition. Will get help right away if you are not doing well or get worse.  Thank you  for choosing an e-visit. Your e-visit answers were reviewed by a board certified advanced clinical practitioner to complete your personal care plan. Depending upon the condition, your plan could have included both over the counter or prescription medications. Please review your pharmacy choice. Be sure that the pharmacy you have chosen is open so that you can pick up your prescription now.  If there is a problem you may message your provider in [place] to have the prescription routed to another pharmacy. Your safety is important to [place]. If you have drug allergies check your prescription carefully.  For the next 24 hours, you can use MyChart to ask questions about today's visit, request a non-urgent call back, or ask for a work or school excuse from your e-visit provider. You will get an email in the next two days asking about your experience. I hope that your e-visit has been valuable and will speed your recovery.

## 2019-05-16 ENCOUNTER — Ambulatory Visit: Payer: 59 | Admitting: Family Medicine

## 2019-05-16 ENCOUNTER — Encounter: Payer: Self-pay | Admitting: Family Medicine

## 2019-05-16 ENCOUNTER — Other Ambulatory Visit: Payer: Self-pay

## 2019-05-16 VITALS — BP 107/70 | Wt 101.0 lb

## 2019-05-16 DIAGNOSIS — F32A Depression, unspecified: Secondary | ICD-10-CM

## 2019-05-16 DIAGNOSIS — F329 Major depressive disorder, single episode, unspecified: Secondary | ICD-10-CM

## 2019-05-16 DIAGNOSIS — F419 Anxiety disorder, unspecified: Secondary | ICD-10-CM

## 2019-05-16 MED ORDER — ESCITALOPRAM OXALATE 5 MG PO TABS: 5.0000 mg | ORAL_TABLET | Freq: Every day | ORAL | 2 refills | Status: DC

## 2019-05-16 NOTE — Progress Notes (Signed)
   Subjective:   Documentation for virtual audio and video telecommunications through [facility] encounter:  The patient was located at work and she is outside her building. 2 patient identifiers used.  The provider was located in the office. The patient did consent to this visit and is aware of possible charges through their insurance for this visit.  The other persons participating in this telemedicine service were none.     Patient ID: Kristy [name], female    DOB: Jul 03, 1981, 38 y.o.   MRN: [id number]  HPI Chief Complaint  Patient presents with  . anxiety    anxiety- usually starts in september but has started now. wellbutrin 150mg , once a day.    Complains of anxiety and depression.  States usually every fall around September she starts having issues with anxiety and depression however this year she feels that it has started much earlier due to current pandemic and limitations on activities.  States her usual outlets for her stress and anxiety are traveling and visiting with friends and she has not been able to do this. Denies thoughts of self-harm.  In the past she has been on Wellbutrin.  States she is not sure if Wellbutrin actually helped or not.  Denies having any side effects.  She would like to try Lexapro.  She requests starting on a low dose.  Denies fever, chills, dizziness, chest pain, palpitations, shortness of breath, abdominal pain, nausea, vomiting, diarrhea, urinary symptoms.  Reviewed allergies, medications, past medical, surgical, family, and social history.    Review of Systems Pertinent positives and negatives in the history of present illness.     Objective:   Physical Exam BP 107/70   Wt 101 lb (45.8 kg)   BMI 19.89 kg/m   Alert and oriented and in no acute distress.  Normal speech, mood and thought process.  Denies SI.      Assessment & Plan:  Anxiety and depression - Plan: escitalopram (LEXAPRO) 5 MG tablet  Discussed limitations of a  virtual visit. Reassured her that increased anxiety and depression is not uncommon particularly during the current pandemic.  She would like to try Lexapro and this was sent to her pharmacy.  Discussed potential side effects.  She will follow-up with me virtually in 2 weeks.  Time spent on call was 14 minutes and in review of previous records 2 minutes total.  This virtual service is not related to other E/M service within previous 7 days.

## 2019-06-24 ENCOUNTER — Other Ambulatory Visit: Payer: Self-pay

## 2019-06-24 DIAGNOSIS — Z20822 Contact with and (suspected) exposure to covid-19: Secondary | ICD-10-CM

## 2019-06-25 LAB — NOVEL CORONAVIRUS, NAA: SARS-CoV-2, NAA: NOT DETECTED

## 2019-11-04 ENCOUNTER — Ambulatory Visit: Payer: 59 | Attending: Internal Medicine

## 2019-11-04 DIAGNOSIS — U071 COVID-19: Secondary | ICD-10-CM

## 2019-11-04 DIAGNOSIS — R238 Other skin changes: Secondary | ICD-10-CM | POA: Diagnosis not present

## 2019-11-05 LAB — NOVEL CORONAVIRUS, NAA: SARS-CoV-2, NAA: NOT DETECTED

## 2019-11-06 ENCOUNTER — Other Ambulatory Visit: Payer: 59

## 2019-12-12 ENCOUNTER — Other Ambulatory Visit: Payer: Self-pay | Admitting: Family

## 2019-12-12 MED ORDER — METRONIDAZOLE 500 MG PO TABS: 500.0000 mg | ORAL_TABLET | Freq: Two times a day (BID) | ORAL | 0 refills | Status: AC

## 2019-12-12 MED ORDER — AZITHROMYCIN 500 MG PO TABS: 1000.0000 mg | ORAL_TABLET | ORAL | 0 refills | Status: DC

## 2019-12-13 ENCOUNTER — Other Ambulatory Visit: Payer: 59

## 2019-12-14 DIAGNOSIS — Z20822 Contact with and (suspected) exposure to covid-19: Secondary | ICD-10-CM | POA: Diagnosis not present

## 2019-12-14 DIAGNOSIS — R0981 Nasal congestion: Secondary | ICD-10-CM | POA: Diagnosis not present

## 2019-12-14 DIAGNOSIS — R5383 Other fatigue: Secondary | ICD-10-CM | POA: Diagnosis not present

## 2019-12-14 DIAGNOSIS — R519 Headache, unspecified: Secondary | ICD-10-CM | POA: Diagnosis not present

## 2019-12-14 DIAGNOSIS — Z8742 Personal history of other diseases of the female genital tract: Secondary | ICD-10-CM | POA: Diagnosis not present

## 2019-12-16 ENCOUNTER — Other Ambulatory Visit: Payer: 59

## 2020-01-16 ENCOUNTER — Encounter: Payer: 59 | Admitting: Family Medicine

## 2020-01-28 DIAGNOSIS — Z803 Family history of malignant neoplasm of breast: Secondary | ICD-10-CM | POA: Diagnosis not present

## 2020-01-28 DIAGNOSIS — Z681 Body mass index (BMI) 19 or less, adult: Secondary | ICD-10-CM | POA: Diagnosis not present

## 2020-01-28 DIAGNOSIS — Z01419 Encounter for gynecological examination (general) (routine) without abnormal findings: Secondary | ICD-10-CM | POA: Diagnosis not present

## 2020-02-27 DIAGNOSIS — H5213 Myopia, bilateral: Secondary | ICD-10-CM | POA: Diagnosis not present

## 2020-03-24 NOTE — Patient Instructions (Signed)
Preventive Care 21-39 Years Old, Female Preventive care refers to visits with your health care provider and lifestyle choices that can promote health and wellness. This includes:  A yearly physical exam. This may also be called an annual well check.  Regular dental visits and eye exams.  Immunizations.  Screening for certain conditions.  Healthy lifestyle choices, such as eating a healthy diet, getting regular exercise, not using drugs or products that contain nicotine and tobacco, and limiting alcohol use. What can I expect for my preventive care visit? Physical exam Your health care provider will check your:  Height and weight. This may be used to calculate body mass index (BMI), which tells if you are at a healthy weight.  Heart rate and blood pressure.  Skin for abnormal spots. Counseling Your health care provider may ask you questions about your:  Alcohol, tobacco, and drug use.  Emotional well-being.  Home and relationship well-being.  Sexual activity.  Eating habits.  Work and work environment.  Method of birth control.  Menstrual cycle.  Pregnancy history. What immunizations do I need?  Influenza (flu) vaccine  This is recommended every year. Tetanus, diphtheria, and pertussis (Tdap) vaccine  You may need a Td booster every 10 years. Varicella (chickenpox) vaccine  You may need this if you have not been vaccinated. Human papillomavirus (HPV) vaccine  If recommended by your health care provider, you may need three doses over 6 months. Measles, mumps, and rubella (MMR) vaccine  You may need at least one dose of MMR. You may also need a second dose. Meningococcal conjugate (MenACWY) vaccine  One dose is recommended if you are age 19-21 years and a first-year college student living in a residence hall, or if you have one of several medical conditions. You may also need additional booster doses. Pneumococcal conjugate (PCV13) vaccine  You may need  this if you have certain conditions and were not previously vaccinated. Pneumococcal polysaccharide (PPSV23) vaccine  You may need one or two doses if you smoke cigarettes or if you have certain conditions. Hepatitis A vaccine  You may need this if you have certain conditions or if you travel or work in places where you may be exposed to hepatitis A. Hepatitis B vaccine  You may need this if you have certain conditions or if you travel or work in places where you may be exposed to hepatitis B. Haemophilus influenzae type b (Hib) vaccine  You may need this if you have certain conditions. You may receive vaccines as individual doses or as more than one vaccine together in one shot (combination vaccines). Talk with your health care provider about the risks and benefits of combination vaccines. What tests do I need?  Blood tests  Lipid and cholesterol levels. These may be checked every 5 years starting at age 20.  Hepatitis C test.  Hepatitis B test. Screening  Diabetes screening. This is done by checking your blood sugar (glucose) after you have not eaten for a while (fasting).  Sexually transmitted disease (STD) testing.  BRCA-related cancer screening. This may be done if you have a family history of breast, ovarian, tubal, or peritoneal cancers.  Pelvic exam and Pap test. This may be done every 3 years starting at age 21. Starting at age 30, this may be done every 5 years if you have a Pap test in combination with an HPV test. Talk with your health care provider about your test results, treatment options, and if necessary, the need for more tests.   Follow these instructions at home: Eating and drinking   Eat a diet that includes fresh fruits and vegetables, whole grains, lean protein, and low-fat dairy.  Take vitamin and mineral supplements as recommended by your health care provider.  Do not drink alcohol if: ? Your health care provider tells you not to drink. ? You are  pregnant, may be pregnant, or are planning to become pregnant.  If you drink alcohol: ? Limit how much you have to 0-1 drink a day. ? Be aware of how much alcohol is in your drink. In the [place] one drink equals one 12 oz bottle of beer (355 mL), one 5 oz glass of wine (148 mL), or one 1 oz glass of hard liquor (44 mL). Lifestyle  Take daily care of your teeth and gums.  Stay active. Exercise for at least 30 minutes on 5 or more days each week.  Do not use any products that contain nicotine or tobacco, such as cigarettes, e-cigarettes, and chewing tobacco. If you need help quitting, ask your health care provider.  If you are sexually active, practice safe sex. Use a condom or other form of birth control (contraception) in order to prevent pregnancy and STIs (sexually transmitted infections). If you plan to become pregnant, see your health care provider for a preconception visit. What's next?  Visit your health care provider once a year for a well check visit.  Ask your health care provider how often you should have your eyes and teeth checked.  Stay up to date on all vaccines. This information is not intended to replace advice given to you by your health care provider. Make sure you discuss any questions you have with your health care provider. Document Revised: 07/12/2018 Document Reviewed: 07/12/2018 Elsevier Patient Education  2020 [employer].

## 2020-03-24 NOTE — Progress Notes (Signed)
Subjective:    Patient ID: Kristy [name], female    DOB: [birth year], 39 y.o.   MRN: [id number]  HPI Chief Complaint  Patient presents with  . fasting cpe    fasting cpe, sees obgyn, no other concerns   She is here for a complete physical exam.  Other providers: Dr. Luretha Rued- eyes Dr. Pamala Hurry - OB/GYN  States she gave up sugar, coffee and alcohol 3 weeks ago.   Genetic testing done due to several family members with breast and ovarian cancers. She was negative for BRCA  Anxiety and depression- states her mood is good. She is not taking Lexapro. States she usually needs this in the fall and winter.   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  Denies smoking, drinking alcohol, drug use  Diet: healthy diet  Excerise: walking and doing beach body   Immunizations: UTD   Health maintenance:  Mammogram: due at age 75. Had a normal one.  Colonoscopy: N/A Last Gynecological Exam: 03/02021 Last Dental Exam: March 2021 Last Eye Exam: April 2021  Wears seatbelt always, uses sunscreen, smoke detectors in home and functioning, does not text while driving and feels safe in home environment.   Reviewed allergies, medications, past medical, surgical, family, and social history.   Review of Systems Review of Systems Constitutional: -fever, -chills, -sweats, -unexpected weight change,-fatigue ENT: -runny nose, -ear pain, -sore throat Cardiology:  -chest pain, -palpitations, -edema Respiratory: -cough, -shortness of breath, -wheezing Gastroenterology: -abdominal pain, -nausea, -vomiting, -diarrhea, -constipation  Hematology: -bleeding or bruising problems Musculoskeletal: -arthralgias, -myalgias, -joint swelling, -back pain Ophthalmology: -vision changes Urology: -dysuria, -difficulty urinating, -hematuria, -urinary frequency, -urgency Neurology: -headache, -weakness, -tingling, -numbness         Objective:   Physical Exam BP 120/70   Pulse 75   Temp (!) 97.5 F (36.4 C)   Ht 5' (1.524 m)   Wt 99 lb 6.4 oz (45.1 kg)   BMI 19.41 kg/m   General Appearance:    Alert, cooperative, no distress, appears stated age  Head:    Normocephalic, without obvious abnormality, atraumatic  Eyes:    PERRL, conjunctiva/corneas clear, EOM's intact  Ears:    Normal TM's and external ear canals  Nose:   Nares normal, mucosa normal, no drainage or sinus   tenderness  Throat:   Lips, mucosa, and tongue normal; teeth and gums normal  Neck:   Supple, no lymphadenopathy;  thyroid:  no   enlargement/tenderness/nodules; no JVD  Back:    Spine nontender, no curvature, ROM normal, no CVA     tenderness  Lungs:     Clear to auscultation bilaterally without wheezes, rales or     ronchi; respirations unlabored  Chest Wall:    No tenderness or deformity   Heart:    Regular rate and rhythm, S1 and S2 normal, no murmur, rub   or gallop  Breast Exam:    OB/GYN  Abdomen:     Soft, non-tender, nondistended, normoactive bowel sounds,    no masses, no hepatosplenomegaly  Genitalia:    OB/GYN  Rectal:    Not performed due to age<40 and no related complaints  Extremities:   No clubbing, cyanosis or edema  Pulses:   2+ and symmetric all extremities  Skin:   Skin color, texture, turgor normal, no rashes or lesions  Lymph nodes:   Cervical, supraclavicular, and axillary nodes normal  Neurologic:   CNII-XII  intact, normal strength, sensation and gait; reflexes 2+ and symmetric throughout          Psych:   Normal mood, affect, hygiene and grooming.         Assessment & Plan:  Routine general medical examination at a health care facility - Plan: CBC with Differential/Platelet, Comprehensive metabolic panel, TSH, T4, free, T3, Lipid panel  Anxiety and depression  History of gestational diabetes - Plan: Hemoglobin A1c  Screening for thyroid disorder - Plan: TSH, T4, free, T3  Screening for lipid disorders -  Plan: Lipid panel  She is here today for her fasting CPE.  She is in her usual state of health and in good spirits.  She has a new job and is enjoying herself. States she does not currently take Lexapro but she often needs medication for her mood in the fall and winter.  She has an OB/GYN and is up-to-date on preventive health care.  Immunizations reviewed and up-to-date.  Discussed safety and health promotion.  History of gestational diabetes and would like to have her A1c checked today.  Follow-up pending labs

## 2020-03-25 ENCOUNTER — Encounter: Payer: Self-pay | Admitting: Family Medicine

## 2020-03-25 ENCOUNTER — Ambulatory Visit (INDEPENDENT_AMBULATORY_CARE_PROVIDER_SITE_OTHER): Payer: 59 | Admitting: Family Medicine

## 2020-03-25 ENCOUNTER — Other Ambulatory Visit: Payer: Self-pay

## 2020-03-25 VITALS — BP 120/70 | HR 75 | Temp 97.5°F | Ht 60.0 in | Wt 99.4 lb

## 2020-03-25 DIAGNOSIS — Z8632 Personal history of gestational diabetes: Secondary | ICD-10-CM

## 2020-03-25 DIAGNOSIS — F32A Depression, unspecified: Secondary | ICD-10-CM

## 2020-03-25 DIAGNOSIS — F419 Anxiety disorder, unspecified: Secondary | ICD-10-CM

## 2020-03-25 DIAGNOSIS — F329 Major depressive disorder, single episode, unspecified: Secondary | ICD-10-CM | POA: Diagnosis not present

## 2020-03-25 DIAGNOSIS — Z1329 Encounter for screening for other suspected endocrine disorder: Secondary | ICD-10-CM

## 2020-03-25 DIAGNOSIS — Z0000 Encounter for general adult medical examination without abnormal findings: Secondary | ICD-10-CM

## 2020-03-25 DIAGNOSIS — Z13220 Encounter for screening for lipoid disorders: Secondary | ICD-10-CM

## 2020-03-26 LAB — COMPREHENSIVE METABOLIC PANEL
ALT: 8 IU/L (ref 0–32)
AST: 15 IU/L (ref 0–40)
Albumin/Globulin Ratio: 1.6 (ref 1.2–2.2)
Albumin: 4.6 g/dL (ref 3.8–4.8)
Alkaline Phosphatase: 65 IU/L (ref 39–117)
BUN/Creatinine Ratio: 14 (ref 9–23)
BUN: 11 mg/dL (ref 6–20)
Bilirubin Total: 0.3 mg/dL (ref 0.0–1.2)
CO2: 21 mmol/L (ref 20–29)
Calcium: 9.5 mg/dL (ref 8.7–10.2)
Chloride: 105 mmol/L (ref 96–106)
Creatinine, Ser: 0.8 mg/dL (ref 0.57–1.00)
GFR calc Af Amer: 108 mL/min/{1.73_m2} (ref >59)
GFR calc non Af Amer: 94 mL/min/{1.73_m2} (ref >59)
Globulin, Total: 2.8 g/dL (ref 1.5–4.5)
Glucose: 91 mg/dL (ref 65–99)
Potassium: 5 mmol/L (ref 3.5–5.2)
Sodium: 139 mmol/L (ref 134–144)
Total Protein: 7.4 g/dL (ref 6.0–8.5)

## 2020-03-26 LAB — CBC WITH DIFFERENTIAL/PLATELET
Basophils Absolute: 0 10*3/uL (ref 0.0–0.2)
Basos: 1 %
EOS (ABSOLUTE): 0.1 10*3/uL (ref 0.0–0.4)
Eos: 2 %
Hematocrit: 38.7 % (ref 34.0–46.6)
Hemoglobin: 13.4 g/dL (ref 11.1–15.9)
Immature Grans (Abs): 0 10*3/uL (ref 0.0–0.1)
Immature Granulocytes: 0 %
Lymphocytes Absolute: 1.8 10*3/uL (ref 0.7–3.1)
Lymphs: 39 %
MCH: 30.9 pg (ref 26.6–33.0)
MCHC: 34.6 g/dL (ref 31.5–35.7)
MCV: 89 fL (ref 79–97)
Monocytes Absolute: 0.3 10*3/uL (ref 0.1–0.9)
Monocytes: 6 %
Neutrophils Absolute: 2.5 10*3/uL (ref 1.4–7.0)
Neutrophils: 52 %
Platelets: 321 10*3/uL (ref 150–450)
RBC: 4.34 x10E6/uL (ref 3.77–5.28)
RDW: 11.7 % (ref 11.7–15.4)
WBC: 4.7 10*3/uL (ref 3.4–10.8)

## 2020-03-26 LAB — HEMOGLOBIN A1C
Est. average glucose Bld gHb Est-mCnc: 100 mg/dL
Hgb A1c MFr Bld: 5.1 % (ref 4.8–5.6)

## 2020-03-26 LAB — LIPID PANEL
Chol/HDL Ratio: 2 ratio (ref 0.0–4.4)
Cholesterol, Total: 152 mg/dL (ref 100–199)
HDL: 75 mg/dL (ref >39)
LDL Chol Calc (NIH): 68 mg/dL (ref 0–99)
Triglycerides: 39 mg/dL (ref 0–149)
VLDL Cholesterol Cal: 9 mg/dL (ref 5–40)

## 2020-03-26 LAB — T4, FREE: Free T4: 1.16 ng/dL (ref 0.82–1.77)

## 2020-03-26 LAB — TSH: TSH: 2.69 u[IU]/mL (ref 0.450–4.500)

## 2020-03-26 LAB — T3: T3, Total: 113 ng/dL (ref 71–180)

## 2020-04-23 ENCOUNTER — Ambulatory Visit (INDEPENDENT_AMBULATORY_CARE_PROVIDER_SITE_OTHER): Payer: 59

## 2020-04-23 ENCOUNTER — Encounter: Payer: Self-pay | Admitting: Family Medicine

## 2020-04-23 ENCOUNTER — Other Ambulatory Visit: Payer: Self-pay

## 2020-04-23 ENCOUNTER — Ambulatory Visit: Payer: 59 | Admitting: Family Medicine

## 2020-04-23 VITALS — BP 122/80 | HR 75 | Ht 60.0 in | Wt 100.0 lb

## 2020-04-23 DIAGNOSIS — M79671 Pain in right foot: Secondary | ICD-10-CM | POA: Diagnosis not present

## 2020-04-23 DIAGNOSIS — S99921A Unspecified injury of right foot, initial encounter: Secondary | ICD-10-CM | POA: Diagnosis not present

## 2020-04-23 NOTE — Patient Instructions (Signed)
Thank you for coming in today. Plan for xray today.  Plan for home exercise.  Let me know if not better.    Ankle Sprain, Phase I Rehab An ankle sprain is an injury to the ligaments of your ankle. Ankle sprains cause stiffness, loss of motion, and loss of strength. Ask your health care provider which exercises are safe for you. Do exercises exactly as told by your health care provider and adjust them as directed. It is normal to feel mild stretching, pulling, tightness, or discomfort as you do these exercises. Stop right away if you feel sudden pain or your pain gets worse. Do not begin these exercises until told by your health care provider. Stretching and range-of-motion exercises These exercises warm up your muscles and joints and improve the movement and flexibility of your lower leg and ankle. These exercises also help to relieve pain and stiffness. Gastroc and soleus stretch This exercise is also called a calf stretch. It stretches the muscles in the back of the lower leg. These muscles are the gastrocnemius, or gastroc, and the soleus. 1. Sit on the floor with your left / right leg extended. 2. Loop a belt or towel around the ball of your left / right foot. The ball of your foot is on the walking surface, right under your toes. 3. Keep your left / right ankle and foot relaxed and keep your knee straight while you use the belt or towel to pull your foot toward you. You should feel a gentle stretch behind your calf or knee in your gastroc muscle. 4. Hold this position for __________ seconds, then release to the starting position. 5. Repeat the exercise with your knee bent. You can put a pillow or a rolled bath towel under your knee to support it. You should feel a stretch deep in your calf in the soleus muscle or at your Achilles tendon. Repeat __________ times. Complete this exercise __________ times a day. Ankle alphabet  1. Sit with your left / right leg supported at the lower leg. ? Do  not rest your foot on anything. ? Make sure your foot has room to move freely. 2. Think of your left / right foot as a paintbrush. ? Move your foot to trace each letter of the alphabet in the air. Keep your hip and knee still while you trace. ? Make the letters as large as you can without feeling discomfort. 3. Trace every letter from A to Z. Repeat __________ times. Complete this exercise __________ times a day. Strengthening exercises These exercises build strength and endurance in your ankle and lower leg. Endurance is the ability to use your muscles for a long time, even after they get tired. Ankle dorsiflexion  1. Secure a rubber exercise band or tube to an object, such as a table leg, that will stay still when the band is pulled. Secure the other end around your left / right foot. 2. Sit on the floor facing the object, with your left / right leg extended. The band or tube should be slightly tense when your foot is relaxed. 3. Slowly bring your foot toward you, bringing the top of your foot toward your shin (dorsiflexion), and pulling the band tighter. 4. Hold this position for __________ seconds. 5. Slowly return your foot to the starting position. Repeat __________ times. Complete this exercise __________ times a day. Ankle plantar flexion  1. Sit on the floor with your left / right leg extended. 2. Loop a rubber exercise  tube or band around the ball of your left / right foot. The ball of your foot is on the walking surface, right under your toes. ? Hold the ends of the band or tube in your hands. ? The band or tube should be slightly tense when your foot is relaxed. 3. Slowly point your foot and toes downward to tilt the top of your foot away from your shin (plantar flexion). 4. Hold this position for __________ seconds. 5. Slowly return your foot to the starting position. Repeat __________ times. Complete this exercise __________ times a day. Ankle eversion 1. Sit on the floor  with your legs straight out in front of you. 2. Loop a rubber exercise band or tube around the ball of your left / right foot. The ball of your foot is on the walking surface, right under your toes. ? Hold the ends of the band in your hands, or secure the band to a stable object. ? The band or tube should be slightly tense when your foot is relaxed. 3. Slowly push your foot outward, away from your other leg (eversion). 4. Hold this position for __________ seconds. 5. Slowly return your foot to the starting position. Repeat __________ times. Complete this exercise __________ times a day. This information is not intended to replace advice given to you by your health care provider. Make sure you discuss any questions you have with your health care provider. Document Revised: 02/19/2019 Document Reviewed: 08/13/2018 Elsevier Patient Education  2020 [employer].

## 2020-04-23 NOTE — Progress Notes (Signed)
    Subjective:    CC: R foot pain   I, Kristy [name], am serving as a Education administrator for Dr. [name].  HPI: Kristy [name] is a 39 year old female who presents to [place] at [facility] today for R foot pain. Kristy [name] plays soccer and states a players cleet landed on the top of the slightly swollen and bruised at the dorsal lateral midfoot.  Sunday evening. States it is feeling better thinks it is just bruised but wanted to check just incase. Describes pain as more of a discomfort.   Pertinent review of Systems: No fevers or chills  Relevant historical information: History depression gestational diabetes.     Objective:    Vitals:   04/23/20 1302  BP: 122/80  Pulse: 75  SpO2: 99%   General: Well Developed, well nourished, and in no acute distress.   MSK: Right foot and ankle: Slightly bruised and swollen at the dorsal lateral midfoot otherwise normal-appearing. Not particularly tender palpation. Normal foot motion and strength. Stable ligamentous exam  Pulses capillary fill and sensation are intact distally.  Lab and Radiology Results X-ray images right foot obtained today personally and independently reviewed No acute fractures.  Accessory ossicles associated with cuboid and navicular bone present.  No significant degenerative changes. Await formal radiology review   Impression and Recommendations:    Assessment and Plan: 39 y.o. female with right foot and ankle pain after being stepped on playing soccer.  Likely contusion with possible foot strain or sprain.  Plan for home exercise program and watchful waiting.  If not improving Kristy [name] will notify me and I will proceed with physical therapy.  Otherwise recheck back as needed.Marland Kitchen  PDMP not reviewed this encounter. Orders Placed This Encounter  Procedures  . DG Foot Complete Right    Standing Status:   Future    Number of Occurrences:   1    Standing Expiration Date:   04/23/2021    Order Specific Question:    Reason for Exam (SYMPTOM  OR DIAGNOSIS REQUIRED)    Answer:   eval foot pain after being stepped on    Order Specific Question:   Is Kristy [name] pregnant?    Answer:   No    Order Specific Question:   Preferred imaging location?    Answer:   [facility]    Order Specific Question:   Radiology Contrast Protocol - do NOT remove file path    Answer:   [email or web address]   No orders of the defined types were placed in this encounter.   Discussed warning signs or symptoms. Please see discharge instructions. Kristy [name] expresses understanding.   The above documentation has been reviewed and is accurate and complete [name], M.D.

## 2020-04-24 NOTE — Progress Notes (Signed)
No fractures seen on xray

## 2020-06-30 ENCOUNTER — Other Ambulatory Visit: Payer: Self-pay

## 2020-06-30 ENCOUNTER — Other Ambulatory Visit: Payer: 59

## 2020-06-30 DIAGNOSIS — Z20822 Contact with and (suspected) exposure to covid-19: Secondary | ICD-10-CM

## 2020-07-01 ENCOUNTER — Other Ambulatory Visit: Payer: 59

## 2020-07-01 LAB — NOVEL CORONAVIRUS, NAA: SARS-CoV-2, NAA: NOT DETECTED

## 2020-07-01 LAB — SARS-COV-2, NAA 2 DAY TAT

## 2020-07-14 DIAGNOSIS — Z20828 Contact with and (suspected) exposure to other viral communicable diseases: Secondary | ICD-10-CM | POA: Diagnosis not present

## 2020-07-22 DIAGNOSIS — D225 Melanocytic nevi of trunk: Secondary | ICD-10-CM | POA: Diagnosis not present

## 2020-07-22 DIAGNOSIS — D485 Neoplasm of uncertain behavior of skin: Secondary | ICD-10-CM | POA: Diagnosis not present

## 2020-07-31 ENCOUNTER — Encounter: Payer: Self-pay | Admitting: Family Medicine

## 2020-07-31 DIAGNOSIS — F419 Anxiety disorder, unspecified: Secondary | ICD-10-CM

## 2020-07-31 DIAGNOSIS — F32A Depression, unspecified: Secondary | ICD-10-CM

## 2020-08-03 MED ORDER — ESCITALOPRAM OXALATE 5 MG PO TABS: ORAL_TABLET | ORAL | 2 refills | Status: DC

## 2021-03-23 ENCOUNTER — Other Ambulatory Visit (HOSPITAL_BASED_OUTPATIENT_CLINIC_OR_DEPARTMENT_OTHER): Payer: Self-pay

## 2021-03-23 MED ORDER — COVID-19 AT HOME ANTIGEN TEST VI KIT
PACK | 0 refills | Status: DC
  Filled 2021-03-23: qty 4, 8d supply, fill #0

## 2021-04-14 ENCOUNTER — Other Ambulatory Visit (HOSPITAL_BASED_OUTPATIENT_CLINIC_OR_DEPARTMENT_OTHER): Payer: Self-pay

## 2021-04-14 DIAGNOSIS — Z85828 Personal history of other malignant neoplasm of skin: Secondary | ICD-10-CM | POA: Diagnosis not present

## 2021-04-14 DIAGNOSIS — D239 Other benign neoplasm of skin, unspecified: Secondary | ICD-10-CM | POA: Diagnosis not present

## 2021-04-14 DIAGNOSIS — L578 Other skin changes due to chronic exposure to nonionizing radiation: Secondary | ICD-10-CM | POA: Diagnosis not present

## 2021-04-14 DIAGNOSIS — L309 Dermatitis, unspecified: Secondary | ICD-10-CM | POA: Diagnosis not present

## 2021-04-14 DIAGNOSIS — D2261 Melanocytic nevi of right upper limb, including shoulder: Secondary | ICD-10-CM | POA: Diagnosis not present

## 2021-04-14 DIAGNOSIS — L814 Other melanin hyperpigmentation: Secondary | ICD-10-CM | POA: Diagnosis not present

## 2021-04-14 DIAGNOSIS — D225 Melanocytic nevi of trunk: Secondary | ICD-10-CM | POA: Diagnosis not present

## 2021-04-14 DIAGNOSIS — D2272 Melanocytic nevi of left lower limb, including hip: Secondary | ICD-10-CM | POA: Diagnosis not present

## 2021-04-14 MED ORDER — TRIAMCINOLONE ACETONIDE 0.1 % EX OINT
TOPICAL_OINTMENT | CUTANEOUS | 0 refills | Status: DC
  Filled 2021-04-14: qty 30, 30d supply, fill #0

## 2021-05-12 ENCOUNTER — Encounter: Payer: Self-pay | Admitting: Internal Medicine

## 2021-05-13 ENCOUNTER — Other Ambulatory Visit (HOSPITAL_BASED_OUTPATIENT_CLINIC_OR_DEPARTMENT_OTHER): Payer: Self-pay

## 2021-05-13 MED ORDER — CARESTART COVID-19 HOME TEST VI KIT
PACK | 0 refills | Status: DC
  Filled 2021-05-13: qty 2, 4d supply, fill #0

## 2021-05-31 DIAGNOSIS — N870 Mild cervical dysplasia: Secondary | ICD-10-CM | POA: Insufficient documentation

## 2021-05-31 DIAGNOSIS — G4700 Insomnia, unspecified: Secondary | ICD-10-CM | POA: Insufficient documentation

## 2021-05-31 DIAGNOSIS — Z803 Family history of malignant neoplasm of breast: Secondary | ICD-10-CM | POA: Insufficient documentation

## 2021-05-31 HISTORY — DX: Insomnia, unspecified: G47.00

## 2021-06-04 ENCOUNTER — Other Ambulatory Visit: Payer: Self-pay | Admitting: Obstetrics

## 2021-06-04 DIAGNOSIS — Z1231 Encounter for screening mammogram for malignant neoplasm of breast: Secondary | ICD-10-CM

## 2021-06-09 ENCOUNTER — Other Ambulatory Visit (HOSPITAL_COMMUNITY): Payer: Self-pay

## 2021-06-09 DIAGNOSIS — F339 Major depressive disorder, recurrent, unspecified: Secondary | ICD-10-CM | POA: Diagnosis not present

## 2021-06-09 DIAGNOSIS — F338 Other recurrent depressive disorders: Secondary | ICD-10-CM | POA: Insufficient documentation

## 2021-06-09 DIAGNOSIS — Z13220 Encounter for screening for lipoid disorders: Secondary | ICD-10-CM | POA: Diagnosis not present

## 2021-06-09 DIAGNOSIS — Z1329 Encounter for screening for other suspected endocrine disorder: Secondary | ICD-10-CM | POA: Diagnosis not present

## 2021-06-09 DIAGNOSIS — Z681 Body mass index (BMI) 19 or less, adult: Secondary | ICD-10-CM | POA: Diagnosis not present

## 2021-06-09 DIAGNOSIS — Z0000 Encounter for general adult medical examination without abnormal findings: Secondary | ICD-10-CM | POA: Diagnosis not present

## 2021-06-09 DIAGNOSIS — Z131 Encounter for screening for diabetes mellitus: Secondary | ICD-10-CM | POA: Diagnosis not present

## 2021-06-09 DIAGNOSIS — Z01419 Encounter for gynecological examination (general) (routine) without abnormal findings: Secondary | ICD-10-CM | POA: Diagnosis not present

## 2021-06-09 MED ORDER — BUPROPION HCL ER (XL) 150 MG PO TB24
150.0000 mg | ORAL_TABLET | Freq: Every day | ORAL | 1 refills | Status: DC
  Filled 2021-06-09 – 2021-06-21 (×4): qty 90, 90d supply, fill #0

## 2021-06-10 DIAGNOSIS — H5213 Myopia, bilateral: Secondary | ICD-10-CM | POA: Diagnosis not present

## 2021-06-16 ENCOUNTER — Other Ambulatory Visit (HOSPITAL_COMMUNITY): Payer: Self-pay

## 2021-06-16 ENCOUNTER — Other Ambulatory Visit (HOSPITAL_BASED_OUTPATIENT_CLINIC_OR_DEPARTMENT_OTHER): Payer: Self-pay

## 2021-06-21 ENCOUNTER — Other Ambulatory Visit (HOSPITAL_BASED_OUTPATIENT_CLINIC_OR_DEPARTMENT_OTHER): Payer: Self-pay

## 2021-06-21 ENCOUNTER — Other Ambulatory Visit: Payer: Self-pay

## 2021-06-21 ENCOUNTER — Ambulatory Visit
Admission: RE | Admit: 2021-06-21 | Discharge: 2021-06-21 | Disposition: A | Discharge status: Home / Self Care | Payer: 59 | Source: Ambulatory Visit | Attending: Obstetrics | Admitting: Obstetrics

## 2021-06-21 ENCOUNTER — Encounter: Payer: 59 | Admitting: Family Medicine

## 2021-06-21 DIAGNOSIS — Z1231 Encounter for screening mammogram for malignant neoplasm of breast: Secondary | ICD-10-CM

## 2021-07-02 ENCOUNTER — Other Ambulatory Visit (HOSPITAL_BASED_OUTPATIENT_CLINIC_OR_DEPARTMENT_OTHER): Payer: Self-pay

## 2021-07-02 ENCOUNTER — Other Ambulatory Visit (HOSPITAL_COMMUNITY): Payer: Self-pay

## 2021-07-07 ENCOUNTER — Other Ambulatory Visit (HOSPITAL_BASED_OUTPATIENT_CLINIC_OR_DEPARTMENT_OTHER): Payer: Self-pay

## 2021-09-27 DIAGNOSIS — R109 Unspecified abdominal pain: Secondary | ICD-10-CM | POA: Diagnosis not present

## 2021-09-27 DIAGNOSIS — N83292 Other ovarian cyst, left side: Secondary | ICD-10-CM | POA: Diagnosis not present

## 2021-12-07 DIAGNOSIS — N83292 Other ovarian cyst, left side: Secondary | ICD-10-CM | POA: Insufficient documentation

## 2021-12-24 DIAGNOSIS — R1032 Left lower quadrant pain: Secondary | ICD-10-CM | POA: Diagnosis not present

## 2021-12-31 ENCOUNTER — Other Ambulatory Visit (HOSPITAL_BASED_OUTPATIENT_CLINIC_OR_DEPARTMENT_OTHER): Payer: Self-pay

## 2021-12-31 MED ORDER — COVID-19 AT HOME ANTIGEN TEST VI KIT
PACK | 0 refills | Status: DC
  Filled 2021-12-31: qty 2, 4d supply, fill #0

## 2022-02-10 ENCOUNTER — Other Ambulatory Visit (HOSPITAL_BASED_OUTPATIENT_CLINIC_OR_DEPARTMENT_OTHER): Payer: Self-pay

## 2022-03-08 ENCOUNTER — Other Ambulatory Visit: Payer: Self-pay

## 2022-06-21 DIAGNOSIS — D2272 Melanocytic nevi of left lower limb, including hip: Secondary | ICD-10-CM | POA: Diagnosis not present

## 2022-06-21 DIAGNOSIS — L814 Other melanin hyperpigmentation: Secondary | ICD-10-CM | POA: Diagnosis not present

## 2022-06-21 DIAGNOSIS — D225 Melanocytic nevi of trunk: Secondary | ICD-10-CM | POA: Diagnosis not present

## 2022-06-21 DIAGNOSIS — D239 Other benign neoplasm of skin, unspecified: Secondary | ICD-10-CM | POA: Diagnosis not present

## 2022-06-21 DIAGNOSIS — L578 Other skin changes due to chronic exposure to nonionizing radiation: Secondary | ICD-10-CM | POA: Diagnosis not present

## 2022-06-21 DIAGNOSIS — Z85828 Personal history of other malignant neoplasm of skin: Secondary | ICD-10-CM | POA: Diagnosis not present

## 2022-06-21 DIAGNOSIS — D2261 Melanocytic nevi of right upper limb, including shoulder: Secondary | ICD-10-CM | POA: Diagnosis not present

## 2022-06-21 DIAGNOSIS — D2230 Melanocytic nevi of unspecified part of face: Secondary | ICD-10-CM | POA: Diagnosis not present

## 2022-06-24 ENCOUNTER — Other Ambulatory Visit: Payer: Self-pay | Admitting: Obstetrics

## 2022-06-24 DIAGNOSIS — Z1231 Encounter for screening mammogram for malignant neoplasm of breast: Secondary | ICD-10-CM

## 2022-07-12 ENCOUNTER — Ambulatory Visit: Payer: 59

## 2022-07-20 DIAGNOSIS — H5213 Myopia, bilateral: Secondary | ICD-10-CM | POA: Diagnosis not present

## 2022-07-22 ENCOUNTER — Ambulatory Visit
Admission: RE | Admit: 2022-07-22 | Discharge: 2022-07-22 | Disposition: A | Discharge status: Home / Self Care | Payer: 59 | Source: Ambulatory Visit | Attending: Obstetrics | Admitting: Obstetrics

## 2022-07-22 DIAGNOSIS — Z1231 Encounter for screening mammogram for malignant neoplasm of breast: Secondary | ICD-10-CM

## 2022-08-04 ENCOUNTER — Ambulatory Visit
Admission: RE | Admit: 2022-08-04 | Discharge: 2022-08-04 | Disposition: A | Discharge status: Home / Self Care | Payer: 59 | Source: Ambulatory Visit | Attending: Obstetrics | Admitting: Obstetrics

## 2022-08-04 DIAGNOSIS — Z1231 Encounter for screening mammogram for malignant neoplasm of breast: Secondary | ICD-10-CM | POA: Diagnosis not present

## 2022-08-10 ENCOUNTER — Ambulatory Visit (INDEPENDENT_AMBULATORY_CARE_PROVIDER_SITE_OTHER): Payer: 59

## 2022-08-10 ENCOUNTER — Ambulatory Visit: Payer: 59 | Admitting: Family Medicine

## 2022-08-10 VITALS — BP 98/64 | HR 68 | Ht 60.0 in | Wt 100.0 lb

## 2022-08-10 DIAGNOSIS — S82891A Other fracture of right lower leg, initial encounter for closed fracture: Secondary | ICD-10-CM | POA: Diagnosis not present

## 2022-08-10 DIAGNOSIS — Z01419 Encounter for gynecological examination (general) (routine) without abnormal findings: Secondary | ICD-10-CM | POA: Diagnosis not present

## 2022-08-10 DIAGNOSIS — Z113 Encounter for screening for infections with a predominantly sexual mode of transmission: Secondary | ICD-10-CM | POA: Diagnosis not present

## 2022-08-10 DIAGNOSIS — Z01411 Encounter for gynecological examination (general) (routine) with abnormal findings: Secondary | ICD-10-CM | POA: Diagnosis not present

## 2022-08-10 DIAGNOSIS — M25571 Pain in right ankle and joints of right foot: Secondary | ICD-10-CM

## 2022-08-10 DIAGNOSIS — Z0142 Encounter for cervical smear to confirm findings of recent normal smear following initial abnormal smear: Secondary | ICD-10-CM | POA: Diagnosis not present

## 2022-08-10 DIAGNOSIS — Z681 Body mass index (BMI) 19 or less, adult: Secondary | ICD-10-CM | POA: Diagnosis not present

## 2022-08-10 DIAGNOSIS — S82891D Other fracture of right lower leg, subsequent encounter for closed fracture with routine healing: Secondary | ICD-10-CM | POA: Diagnosis not present

## 2022-08-10 DIAGNOSIS — Z124 Encounter for screening for malignant neoplasm of cervix: Secondary | ICD-10-CM | POA: Diagnosis not present

## 2022-08-10 LAB — HM PAP SMEAR: HM Pap smear: NEGATIVE

## 2022-08-10 NOTE — Patient Instructions (Signed)
Thank you for coming in today.    

## 2022-08-10 NOTE — Progress Notes (Signed)
   I, Peterson Lombard, LAT, ATC acting as a scribe for [name], MD.  Subjective:    CC: R ankle pain  HPI: Pt is a 41 y/o female c/o R ankle pain. Pt was previously seen by Dr. Georgina Snell on 04/23/20 for R foot pain from a soccer injury. Today, pt c/o R ankle pain ongoing since last night. MOI: Pt kicked the soccer ball and then felt a loud pop. Pt locates pain to the R distal fibula.  Dx imaging: 04/23/20 R foot XR  Pertinent review of Systems: No fevers or chills  Relevant historical information: Positive family history for osteoporosis.   Objective:    Vitals:   08/10/22 1038  BP: 98/64  Pulse: 68  SpO2: 100%   General: Well Developed, well nourished, and in no acute distress.   MSK: Right ankle swollen and tender at lateral malleolus. Nontender proximal fibular head.  Ankle mobility not tested.  Pulses cap refill and sensation are intact distally.  Lab and Radiology Results  X-ray images right ankle obtained today personally and independently interpreted. Minimally displaced oblique to spiral fracture of the distal fibula.  Ankle mortise is not significantly disrupted. Await formal radiology review   Patient was placed into a custom made stirrups fiberglass splint.  Impression and Recommendations:    Assessment and Plan: 41 y.o. female with right lateral ankle fracture at distal fibula.  She has minimal displacement.  However she is very active participating in running and soccer.  Plan to treat with immobilization with stirrups splint and nonweightbearing with crutches and knee scooter.  Will discuss with orthopedic colleagues.  Given her activity level and the minimal displacement she may be a reasonable candidate for surgical ORIF.  Otherwise recheck with me in 10 days.  Anticipate repeat x-ray at that time.  Pain management discussed prescription strength NSAIDs and Tylenol.  PDMP not reviewed this encounter. Orders Placed This Encounter  Procedures   DG Ankle  Complete Right    Standing Status:   Future    Number of Occurrences:   1    Standing Expiration Date:   08/11/2023    Order Specific Question:   Reason for Exam (SYMPTOM  OR DIAGNOSIS REQUIRED)    Answer:   right ankle pain    Order Specific Question:   Preferred imaging location?    Answer:   [facility]    Order Specific Question:   Is patient pregnant?    Answer:   No   No orders of the defined types were placed in this encounter.   Discussed warning signs or symptoms. Please see discharge instructions. Patient expresses understanding.   The above documentation has been reviewed and is accurate and complete [name], M.D.

## 2022-08-12 ENCOUNTER — Encounter: Payer: Self-pay | Admitting: Family Medicine

## 2022-08-12 NOTE — Progress Notes (Signed)
Ankle fracture is present as we discussed.  I talked about it with an orthopedic colleague who agrees with me that this does not need surgery at this time but if it changes we would recommend surgery.  We will look at it again in about 10 days.

## 2022-08-18 NOTE — Progress Notes (Signed)
   I, Peterson Lombard, LAT, ATC acting as a scribe for [name], MD.  Kristy [name] is a 41 y.o. female who presents to [place] at [facility] today for f/u closed R ankle fx of the distal fibula. MOI: Pt kicked the soccer ball and then felt a loud pop. Pt was last seen by Dr. Georgina Snell on 08/10/22 and was advise to treat with immobilization with stirrups splint and nonweightbearing with crutches and knee scooter. Pt sent a MyChart message on 9/29 reporting losing her balance and falling, landing on her R ankle. Today, pt reports R ankle is feeling about the same. Pt notes that since the fall, the R ankle became swollen and bruised.    Dx imaging: 08/10/22 04/23/20 R foot XR  Pertinent review of systems: No fevers or chills  Relevant historical information: History of gestational diabetes   Exam:  BP 112/76   Pulse 91   Ht 5' (1.524 m)   LMP 07/30/2022   SpO2 99%   BMI 19.53 kg/m  General: Well Developed, well nourished, and in no acute distress.   MSK: Right ankle swollen and bruised.  Decreased range of motion.  Stability exam not tested.  Capillary fill and pulses are intact distally.    Lab and Radiology Results No results found for this or any previous visit (from the past 72 hour(s)). DG Ankle Complete Right  Result Date: 08/20/2022 CLINICAL DATA:  Follow-up right ankle fracture EXAM: RIGHT ANKLE - COMPLETE 3 VIEW COMPARISON:  Right ankle radiographs August 10, 2022 FINDINGS: Redemonstrated oblique minimally displaced fracture of the distal fibula with interval mild osseous bridging. No new acute fracture. Intact ankle mortise. Remainder of the joint spaces are well maintained. Decreased soft tissue swelling. IMPRESSION: Interval osseous bridging of the oblique minimally displaced fracture of the distal fibular shaft. Electronically Signed   By: Beryle Flock M.D.   On: 08/20/2022 16:19   I, [name], personally (independently) visualized and performed  the interpretation of the images attached in this note.  Patient was placed into a custom made well-formed short leg nonweightbearing fiberglass cast.   Assessment and Plan: 41 y.o. female with right ankle distal radius fracture.  It appears to be minimally displaced per my read.  Plan for short leg nonweightbearing cast.  I spoke with Dr. Marlou Sa at [facility] who would like to see her as she is on the borderline where surgery would make sense. Otherwise check back with me in 2 weeks.   PDMP not reviewed this encounter. Orders Placed This Encounter  Procedures   DG Ankle Complete Right    Standing Status:   Future    Number of Occurrences:   1    Standing Expiration Date:   08/19/2023    Order Specific Question:   Reason for Exam (SYMPTOM  OR DIAGNOSIS REQUIRED)    Answer:   right ankle pain    Order Specific Question:   Preferred imaging location?    Answer:   [facility]    Order Specific Question:   Is patient pregnant?    Answer:   No   No orders of the defined types were placed in this encounter.    Discussed warning signs or symptoms. Please see discharge instructions. Patient expresses understanding.   I, [name], personally (independently) visualized and performed the interpretation of the images attached in this note.

## 2022-08-19 ENCOUNTER — Ambulatory Visit: Payer: 59 | Admitting: Family Medicine

## 2022-08-19 ENCOUNTER — Ambulatory Visit (INDEPENDENT_AMBULATORY_CARE_PROVIDER_SITE_OTHER): Payer: 59

## 2022-08-19 VITALS — BP 112/76 | HR 91 | Ht 60.0 in

## 2022-08-19 DIAGNOSIS — S82891D Other fracture of right lower leg, subsequent encounter for closed fracture with routine healing: Secondary | ICD-10-CM

## 2022-08-19 DIAGNOSIS — M25571 Pain in right ankle and joints of right foot: Secondary | ICD-10-CM

## 2022-08-19 DIAGNOSIS — S82433A Displaced oblique fracture of shaft of unspecified fibula, initial encounter for closed fracture: Secondary | ICD-10-CM | POA: Diagnosis not present

## 2022-08-19 NOTE — Patient Instructions (Signed)
Thank you for coming in today.   Remain non-weight bearing  Check back in 2 weeks.

## 2022-08-22 ENCOUNTER — Ambulatory Visit: Payer: 59 | Admitting: Orthopedic Surgery

## 2022-08-22 ENCOUNTER — Encounter: Payer: Self-pay | Admitting: Family Medicine

## 2022-08-22 NOTE — Progress Notes (Signed)
The ankle is starting to heal.  I did talk this over with Dr. Marlou Sa the orthopedic surgeon and he would like to see you in his clinic just to double check things especially your physical exam and make sure the ankle still feels stable. He is not leaning to surgery but he and I both want to make sure that your ankle is going to be as good as it can be. His office should contact you soon.

## 2022-08-23 DIAGNOSIS — F4320 Adjustment disorder, unspecified: Secondary | ICD-10-CM | POA: Diagnosis not present

## 2022-08-24 ENCOUNTER — Telehealth: Payer: Self-pay | Admitting: Orthopedic Surgery

## 2022-08-24 NOTE — Telephone Encounter (Signed)
Pt called back to set an appt. Pt states she had to cancel last appt due to no transportation and can reschedule now. Last visit appt note states Dr Marlou Sa asked to work her in. Please call pt about this matter at [phone].

## 2022-08-25 NOTE — Telephone Encounter (Signed)
Tried calling to schedule appt, no answer. LMVM to call back to schedule appt.

## 2022-08-25 NOTE — Telephone Encounter (Signed)
Error

## 2022-08-25 NOTE — Telephone Encounter (Signed)
Please work patient in upon return call.

## 2022-08-26 ENCOUNTER — Ambulatory Visit (INDEPENDENT_AMBULATORY_CARE_PROVIDER_SITE_OTHER): Payer: 59

## 2022-08-26 ENCOUNTER — Ambulatory Visit (INDEPENDENT_AMBULATORY_CARE_PROVIDER_SITE_OTHER): Payer: 59 | Admitting: Surgical

## 2022-08-26 DIAGNOSIS — S82891D Other fracture of right lower leg, subsequent encounter for closed fracture with routine healing: Secondary | ICD-10-CM

## 2022-08-26 DIAGNOSIS — S82891A Other fracture of right lower leg, initial encounter for closed fracture: Secondary | ICD-10-CM

## 2022-08-28 ENCOUNTER — Encounter: Payer: Self-pay | Admitting: Surgical

## 2022-08-28 NOTE — Progress Notes (Signed)
Office Visit Note   Patient: Kristy [name]           Date of Birth: June 08, 1981           MRN: [id number] Visit Date: 08/26/2022 Requested by: Girtha Rm, NP-C [place],  [place] [id number] PCP: Girtha Rm, NP-C  Subjective: Chief Complaint  Patient presents with   Right Ankle - Pain    HPI: Kristy [name] is a 41 y.o. female who presents to the office reporting right ankle pain.  Patient states that she sustained ankle injury where she awkwardly planted her right foot while kicking a soccer ball, sustaining injury with severe pain on 08/09/2022.  She was then seen by Dr. [name] who placed her in a splint on 9/27 and then saw her in follow-up on 10/6 with radiographs at that time demonstrating no further displacement of the minimally displaced lateral malleolus fracture.  She reports that she has no history of any significant injury to the right ankle aside from very rare ankle sprains.  She is a very active individual and used to run half marathons and 5/10K's but she has given this up in favor of walking, hiking, biking as well as playing tennis and soccer in leagues.  She has been nonweightbearing since her injury.  Had cast placed on 10/6 that was removed today in the office.  She has no significant medical history.  No history of low vitamin D.  She does not smoke.  No history of DVT/PE in her family or personally.  She has been taking 1 baby aspirin every other day.  She does not take any birth control oral contraceptives.              ROS: All systems reviewed are negative as they relate to the chief complaint within the history of present illness.  Patient denies fevers or chills.  Assessment & Plan: Visit Diagnoses:  1. Closed fracture of right ankle with routine healing, subsequent encounter     Plan: Patient is a 41 year old female who presents for evaluation of right ankle fracture.  She has isolated lateral malleolus fracture with mild  displacement that she initially sustained on 08/09/2022.  She has been in a splint and then a cast and being followed by Dr. Georgina Snell.  She is a very active individual.  She has new radiographs today demonstrating no interval displacement at the fracture site with no evidence of shortening and normal talocrural angle.  There is no evidence of any asymmetric laxity when stressing her syndesmosis compared to contralateral side.  There is no callus formation noted yet but she is only about 2 and half weeks out so this is expected.  Plan to have her take over-the-counter vitamin D supplement and continue nonweightbearing status.  Take baby aspirin once daily to help prevent any DVT.  She was placed in a fracture boot and she will follow-up for clinical recheck with Dr. Marlou Sa in 10 days with new radiographs at that time and likely initiation of weightbearing in the fracture boot.  Clinically it seems like she is healing the fracture without issue as she does not really have that much discomfort with palpation or significant swelling on exam today.  Patient was discussed with Dr. Marlou Sa  Follow-Up Instructions: No follow-ups on file.   Orders:  Orders Placed This Encounter  Procedures   XR Ankle Complete Right   No orders of the defined types were placed in this encounter.  Procedures: No procedures performed   Clinical Data: No additional findings.  Objective: Vital Signs: LMP 07/30/2022   Physical Exam:  Constitutional: Patient appears well-developed HEENT:  Head: Normocephalic Eyes:EOM are normal Neck: Normal range of motion Cardiovascular: Normal rate Pulmonary/chest: Effort normal Neurologic: Patient is alert Skin: Skin is warm Psychiatric: Patient has normal mood and affect  Ortho Exam: Ortho exam demonstrates right ankle with intact ankle dorsiflexion, plantarflexion, inversion, eversion.  She has minimal ecchymosis noted just primarily in the dorsum of the forefoot in the first  webspace but nothing really around the ankle at this time.  There is no plantar ecchymosis noted.  She has palpable 2+ DP pulse of the bilateral lower extremities.  There is no calf swelling noted.  No calf tenderness, negative Homans' sign.  She has no proximal fibular tenderness.  Stable to anterior drawer sign with equivalent laxity compared with the contralateral side.  She is also stable to stress of the syndesmosis of the right lower extremity that has equivalent laxity compared with the contralateral side.  She has mild discomfort with tenderness to palpation over the fracture site but overall she is able to tolerate this pretty well.  There is no tenderness over the medial malleolus.  She has intact Achilles tendon and anterior tibialis tendon by palpation  Specialty Comments:  No specialty comments available.  Imaging: No results found.   PMFS History: Patient Active Problem List   Diagnosis Date Noted   Ankle fracture, right 08/10/2022   History of gestational diabetes 12/26/2018   Depression 10/19/2016   Past Medical History:  Diagnosis Date   Abnormal Pap smear    Dysrhythmia    tachycardia   Gestational diabetes     Family History  Problem Relation Age of Onset   Breast cancer Mother    Cancer Mother        breast   Hyperlipidemia Mother    Hypertension Father    Heart disease Father    Cancer Paternal Aunt    Breast cancer Maternal Grandmother    Prostate cancer Maternal Grandfather    Lung cancer Paternal Grandmother    Hyperlipidemia Brother    Hypertension Brother     Past Surgical History:  Procedure Laterality Date   NO PAST SURGERIES     Social History   Occupational History   Not on file  Tobacco Use   Smoking status: Never   Smokeless tobacco: Never  Substance and Sexual Activity   Alcohol use: No   Drug use: No   Sexual activity: Not Currently

## 2022-08-31 ENCOUNTER — Other Ambulatory Visit (HOSPITAL_BASED_OUTPATIENT_CLINIC_OR_DEPARTMENT_OTHER): Payer: Self-pay

## 2022-08-31 ENCOUNTER — Ambulatory Visit: Payer: 59 | Admitting: Family Medicine

## 2022-08-31 VITALS — BP 112/76 | HR 80 | Ht 60.0 in

## 2022-08-31 DIAGNOSIS — M25571 Pain in right ankle and joints of right foot: Secondary | ICD-10-CM | POA: Diagnosis not present

## 2022-08-31 DIAGNOSIS — F4320 Adjustment disorder, unspecified: Secondary | ICD-10-CM | POA: Diagnosis not present

## 2022-08-31 DIAGNOSIS — S82891D Other fracture of right lower leg, subsequent encounter for closed fracture with routine healing: Secondary | ICD-10-CM

## 2022-08-31 MED ORDER — INFLUENZA VAC SPLIT QUAD 0.5 ML IM SUSY
PREFILLED_SYRINGE | INTRAMUSCULAR | 0 refills | Status: DC
  Filled 2022-08-31: qty 0.5, 1d supply, fill #0

## 2022-08-31 NOTE — Progress Notes (Signed)
I, Peterson Lombard, LAT, ATC acting as a scribe for [name], MD.  Kristy [name] is a 41 y.o. female who presents to [place] at [facility] today for follow-up closed R ankle fx of the distal fibula. MOI: Pt kicked the soccer ball and then felt a loud pop. Pt was last seen by Dr. Georgina Snell on 08/19/2022 and was placed in a short leg nonweightbearing cast and discussion was had with Dr. Marlou Sa who would also like to see her since the fracture was borderline.  Patient was seen by Dr. Randel Pigg PA, on 08/26/2022, and was advised to continue nonweightbearing status in a CAM walker boot and take over-the-counter vitamin D supplements.  Today, patient reports her R ankle is feeling OK. She prefers to wear the CAM walker boot we gave her vs the heavier one from [facility].   Dx imaging: 08/26/2022 R ankle x-ray 08/19/2022 R ankle x-ray 08/10/22 R ankle x-ray 04/23/20 R foot XR  Pertinent review of systems: No fevers or chills  Relevant historical information: Gestational diabetes history.   Exam:  BP 112/76   Pulse 80   Ht 5' (1.524 m)   LMP 07/30/2022   SpO2 99%   BMI 19.53 kg/m  General: Well Developed, well nourished, and in no acute distress.   MSK: Right ankle: Still some bruising present. Tender palpation of the lateral malleolus.  Stable ligamentous exam.  Decreased range of motion.  Pulses capillary fill and sensation are intact distally.    Lab and Radiology Results  X-ray images right ankle obtained on the 13th at orthopedic office personally and independently interpreted. Minimally displaced ankle fracture with no callus formation present. Formal radiology review    Assessment and Plan: 41 y.o. female with right ankle fracture.  Clinically doing well.  Patient has been evaluated by orthopedics and fortunately is not going to have surgery. Plan to continue conservative management with cam walker boot and nonweightbearing status.  She had an awkward time  for x-ray.  She had an x-ray 5 days ago and is a little too soon for repeat x-ray.  She is going to see Dr. Marlou Sa on November 1.  We will get an x-ray about halfway between the 13th and November 1 on 23 October just before she leaves for a trip.  Additionally we will get a DEXA scan as well in the interim.    PDMP not reviewed this encounter. Orders Placed This Encounter  Procedures   DG Ankle Complete Right    Standing Status:   Future    Standing Expiration Date:   09/01/2023    Order Specific Question:   Reason for Exam (SYMPTOM  OR DIAGNOSIS REQUIRED)    Answer:   eval ankle fx    Order Specific Question:   Is patient pregnant?    Answer:   No    Order Specific Question:   Preferred imaging location?    Answer:   [facility]   DG BONE DENSITY (DXA)    Standing Status:   Future    Standing Expiration Date:   09/01/2023    Order Specific Question:   Reason for Exam (SYMPTOM  OR DIAGNOSIS REQUIRED)    Answer:   eval bone density    Order Specific Question:   Is the patient pregnant?    Answer:   No    Order Specific Question:   Preferred imaging location?    Answer:   [place] [place]   No orders  of the defined types were placed in this encounter.    Discussed warning signs or symptoms. Please see discharge instructions. Patient expresses understanding.   The above documentation has been reviewed and is accurate and complete [name], M.D.

## 2022-08-31 NOTE — Patient Instructions (Signed)
Thank you for coming in today.   Xray on the 23rd. You dont need to see me.   Dexa scan whenever. Ask front desk.   Good job on calcium and Vit D.   Aspirin is ok 81 mg is ok.   Let me know how it goes with Dr Marlou Sa.

## 2022-09-02 ENCOUNTER — Ambulatory Visit: Payer: 59 | Admitting: Family Medicine

## 2022-09-05 ENCOUNTER — Ambulatory Visit (INDEPENDENT_AMBULATORY_CARE_PROVIDER_SITE_OTHER): Payer: 59

## 2022-09-05 DIAGNOSIS — S82891D Other fracture of right lower leg, subsequent encounter for closed fracture with routine healing: Secondary | ICD-10-CM

## 2022-09-05 DIAGNOSIS — S82431A Displaced oblique fracture of shaft of right fibula, initial encounter for closed fracture: Secondary | ICD-10-CM | POA: Diagnosis not present

## 2022-09-06 NOTE — Progress Notes (Signed)
Right ankle x-ray shows some early healing signs.  Continue immobilization and follow-up with Dr. Marlou Sa as scheduled

## 2022-09-06 NOTE — Progress Notes (Signed)
Ok thx.

## 2022-09-13 DIAGNOSIS — F4320 Adjustment disorder, unspecified: Secondary | ICD-10-CM | POA: Diagnosis not present

## 2022-09-14 ENCOUNTER — Ambulatory Visit (INDEPENDENT_AMBULATORY_CARE_PROVIDER_SITE_OTHER): Payer: 59

## 2022-09-14 ENCOUNTER — Ambulatory Visit (INDEPENDENT_AMBULATORY_CARE_PROVIDER_SITE_OTHER): Payer: 59 | Admitting: Orthopedic Surgery

## 2022-09-14 DIAGNOSIS — S82891D Other fracture of right lower leg, subsequent encounter for closed fracture with routine healing: Secondary | ICD-10-CM

## 2022-09-15 ENCOUNTER — Encounter: Payer: Self-pay | Admitting: Orthopedic Surgery

## 2022-09-15 NOTE — Progress Notes (Signed)
   Post-Op Visit Note   Patient: Kristy [name]           Date of Birth: 08-Nov-1981           MRN: [id number] Visit Date: 09/14/2022 PCP: Girtha Rm, NP-C   Assessment & Plan:  Chief Complaint:  Chief Complaint  Patient presents with   Right Ankle - Follow-up, Fracture    DOI: 08/09/22 right ankle fx   Visit Diagnoses:  1. Closed fracture of right ankle with routine healing, subsequent encounter     Plan: Patient presents now for follow-up of right ankle lateral malleolar fracture.  She is about 4 weeks out.  Taking vitamin D.  On examination she has resolving ecchymosis around the distal lateral aspect of the forefoot.  No real tenderness over the lateral malleolus.  Ankle range of motion is excellent.  No calf tenderness negative Homans on the right.  Plan at this time is weightbearing as tolerated in fracture boot.  Come back in 2 weeks at which time I would expect to see some callus formation around the fracture.  The fracture itself feels stable with ankle manipulation and she also has no tenderness around the fracture site.  Follow-Up Instructions: No follow-ups on file.   Orders:  Orders Placed This Encounter  Procedures   XR Ankle Complete Right   No orders of the defined types were placed in this encounter.   Imaging: No results found.  PMFS History: Patient Active Problem List   Diagnosis Date Noted   Ankle fracture, right 08/10/2022   History of gestational diabetes 12/26/2018   Depression 10/19/2016   Past Medical History:  Diagnosis Date   Abnormal Pap smear    Dysrhythmia    tachycardia   Gestational diabetes     Family History  Problem Relation Age of Onset   Breast cancer Mother    Cancer Mother        breast   Hyperlipidemia Mother    Hypertension Father    Heart disease Father    Cancer Paternal Aunt    Breast cancer Maternal Grandmother    Prostate cancer Maternal Grandfather    Lung cancer Paternal Grandmother    Hyperlipidemia  Brother    Hypertension Brother     Past Surgical History:  Procedure Laterality Date   NO PAST SURGERIES     Social History   Occupational History   Not on file  Tobacco Use   Smoking status: Never   Smokeless tobacco: Never  Substance and Sexual Activity   Alcohol use: No   Drug use: No   Sexual activity: Not Currently

## 2022-09-20 DIAGNOSIS — F4320 Adjustment disorder, unspecified: Secondary | ICD-10-CM | POA: Diagnosis not present

## 2022-09-22 ENCOUNTER — Ambulatory Visit: Payer: 59 | Admitting: Family Medicine

## 2022-10-03 DIAGNOSIS — F4320 Adjustment disorder, unspecified: Secondary | ICD-10-CM | POA: Diagnosis not present

## 2022-10-05 ENCOUNTER — Ambulatory Visit (INDEPENDENT_AMBULATORY_CARE_PROVIDER_SITE_OTHER): Payer: 59

## 2022-10-05 ENCOUNTER — Ambulatory Visit: Payer: 59 | Admitting: Orthopedic Surgery

## 2022-10-05 DIAGNOSIS — S82891D Other fracture of right lower leg, subsequent encounter for closed fracture with routine healing: Secondary | ICD-10-CM

## 2022-10-06 LAB — VITAMIN D 25 HYDROXY (VIT D DEFICIENCY, FRACTURES): Vit D, 25-Hydroxy: 36 ng/mL (ref 30–100)

## 2022-10-08 ENCOUNTER — Encounter: Payer: Self-pay | Admitting: Orthopedic Surgery

## 2022-10-08 NOTE — Progress Notes (Signed)
   Post-Op Visit Note   Patient: Kristy [name]           Date of Birth: Apr 02, 1981           MRN: [id number] Visit Date: 10/05/2022 PCP: Girtha Rm, NP-C   Assessment & Plan:  Chief Complaint:  Chief Complaint  Patient presents with   Right Ankle - Follow-up, Fracture    DOI: 08/09/22   Visit Diagnoses:  1. Closed fracture of right ankle with routine healing, subsequent encounter     Plan: Kristy [name] is a 41 year old patient who is now about 7 weeks out from right ankle lateral malleolar fracture.  She has been on vitamin D orally for 3 weeks.  She is been weightbearing in the fracture boot.  Having occasional pain around the lateral malleolar region.  Concerned also about her midfoot region.  On examination she has no pain with pronation supination of the right forefoot.  Pedal pulses intact.  Minimal tenderness over the lateral malleolus.  No tenderness over the medial malleolus.  No calf tenderness negative Homans.  Radiographs show minimal to no callus formation.  Plan is to check vitamin D level as well as CT scan right ankle to evaluate for healing.  I think it is possible that her healing to be delayed due to [name] vitamin D.  We will assess fracture healing on the CT scan and decide for or against plate fixation at that time  Follow-Up Instructions: No follow-ups on file.   Orders:  Orders Placed This Encounter  Procedures   XR Ankle Complete Right   XR Foot Complete Right   CT ANKLE RIGHT WO CONTRAST   Vitamin D (25 hydroxy)   No orders of the defined types were placed in this encounter.   Imaging: No results found.  PMFS History: Patient Active Problem List   Diagnosis Date Noted   Ankle fracture, right 08/10/2022   History of gestational diabetes 12/26/2018   Depression 10/19/2016   Past Medical History:  Diagnosis Date   Abnormal Pap smear    Dysrhythmia    tachycardia   Gestational diabetes     Family History  Problem Relation Age of Onset    Breast cancer Mother    Cancer Mother        breast   Hyperlipidemia Mother    Hypertension Father    Heart disease Father    Cancer Paternal Aunt    Breast cancer Maternal Grandmother    Prostate cancer Maternal Grandfather    Lung cancer Paternal Grandmother    Hyperlipidemia Brother    Hypertension Brother     Past Surgical History:  Procedure Laterality Date   NO PAST SURGERIES     Social History   Occupational History   Not on file  Tobacco Use   Smoking status: Never   Smokeless tobacco: Never  Substance and Sexual Activity   Alcohol use: No   Drug use: No   Sexual activity: Not Currently

## 2022-10-10 ENCOUNTER — Other Ambulatory Visit: Payer: Self-pay | Admitting: Surgical

## 2022-10-10 MED ORDER — VITAMIN D (ERGOCALCIFEROL) 1.25 MG (50000 UNIT) PO CAPS: 50000.0000 [IU] | ORAL_CAPSULE | ORAL | 0 refills | Status: DC

## 2022-10-11 DIAGNOSIS — F4320 Adjustment disorder, unspecified: Secondary | ICD-10-CM | POA: Diagnosis not present

## 2022-10-14 ENCOUNTER — Other Ambulatory Visit (HOSPITAL_COMMUNITY): Payer: Self-pay

## 2022-10-19 DIAGNOSIS — F4320 Adjustment disorder, unspecified: Secondary | ICD-10-CM | POA: Diagnosis not present

## 2022-10-20 DIAGNOSIS — S8291XA Unspecified fracture of right lower leg, initial encounter for closed fracture: Secondary | ICD-10-CM | POA: Diagnosis not present

## 2022-10-24 ENCOUNTER — Other Ambulatory Visit: Payer: 59

## 2022-10-28 ENCOUNTER — Ambulatory Visit
Admission: RE | Admit: 2022-10-28 | Discharge: 2022-10-28 | Disposition: A | Discharge status: Home / Self Care | Payer: 59 | Source: Ambulatory Visit | Attending: Orthopedic Surgery | Admitting: Orthopedic Surgery

## 2022-10-28 DIAGNOSIS — S82891D Other fracture of right lower leg, subsequent encounter for closed fracture with routine healing: Secondary | ICD-10-CM

## 2022-10-28 DIAGNOSIS — M7989 Other specified soft tissue disorders: Secondary | ICD-10-CM | POA: Diagnosis not present

## 2022-11-01 ENCOUNTER — Telehealth: Payer: Self-pay

## 2022-11-01 DIAGNOSIS — F4320 Adjustment disorder, unspecified: Secondary | ICD-10-CM | POA: Diagnosis not present

## 2022-11-01 NOTE — Telephone Encounter (Signed)
IC to schedule appt, no answer. LMVM to call us back to schedule appt. Please follow up to make sure patient gets appt.

## 2022-11-01 NOTE — Telephone Encounter (Signed)
-----   Message from Meredith Pel, MD sent at 11/01/2022  7:16 AM EST ----- Can you set her up with follow-up with [place] sometime soon thanks

## 2022-11-01 NOTE — Progress Notes (Signed)
Can you set her up with follow-up with [place] sometime soon thanks

## 2022-11-04 DIAGNOSIS — M25671 Stiffness of right ankle, not elsewhere classified: Secondary | ICD-10-CM | POA: Diagnosis not present

## 2022-11-04 DIAGNOSIS — M25571 Pain in right ankle and joints of right foot: Secondary | ICD-10-CM | POA: Diagnosis not present

## 2022-11-08 DIAGNOSIS — S8291XD Unspecified fracture of right lower leg, subsequent encounter for closed fracture with routine healing: Secondary | ICD-10-CM | POA: Diagnosis not present

## 2022-11-09 DIAGNOSIS — M25571 Pain in right ankle and joints of right foot: Secondary | ICD-10-CM | POA: Diagnosis not present

## 2022-11-09 DIAGNOSIS — M25671 Stiffness of right ankle, not elsewhere classified: Secondary | ICD-10-CM | POA: Diagnosis not present

## 2022-11-17 DIAGNOSIS — M25571 Pain in right ankle and joints of right foot: Secondary | ICD-10-CM | POA: Diagnosis not present

## 2022-11-17 DIAGNOSIS — M25671 Stiffness of right ankle, not elsewhere classified: Secondary | ICD-10-CM | POA: Diagnosis not present

## 2022-11-22 DIAGNOSIS — F4320 Adjustment disorder, unspecified: Secondary | ICD-10-CM | POA: Diagnosis not present

## 2022-11-28 DIAGNOSIS — M25571 Pain in right ankle and joints of right foot: Secondary | ICD-10-CM | POA: Diagnosis not present

## 2022-11-28 DIAGNOSIS — M25671 Stiffness of right ankle, not elsewhere classified: Secondary | ICD-10-CM | POA: Diagnosis not present

## 2022-11-29 DIAGNOSIS — F4320 Adjustment disorder, unspecified: Secondary | ICD-10-CM | POA: Diagnosis not present

## 2022-11-30 DIAGNOSIS — S8291XD Unspecified fracture of right lower leg, subsequent encounter for closed fracture with routine healing: Secondary | ICD-10-CM | POA: Diagnosis not present

## 2022-12-05 DIAGNOSIS — M25671 Stiffness of right ankle, not elsewhere classified: Secondary | ICD-10-CM | POA: Diagnosis not present

## 2022-12-05 DIAGNOSIS — M25571 Pain in right ankle and joints of right foot: Secondary | ICD-10-CM | POA: Diagnosis not present

## 2022-12-06 DIAGNOSIS — F4320 Adjustment disorder, unspecified: Secondary | ICD-10-CM | POA: Diagnosis not present

## 2022-12-08 DIAGNOSIS — M25571 Pain in right ankle and joints of right foot: Secondary | ICD-10-CM | POA: Diagnosis not present

## 2022-12-08 DIAGNOSIS — M25671 Stiffness of right ankle, not elsewhere classified: Secondary | ICD-10-CM | POA: Diagnosis not present

## 2022-12-12 DIAGNOSIS — M25671 Stiffness of right ankle, not elsewhere classified: Secondary | ICD-10-CM | POA: Diagnosis not present

## 2022-12-12 DIAGNOSIS — M25571 Pain in right ankle and joints of right foot: Secondary | ICD-10-CM | POA: Diagnosis not present

## 2022-12-13 DIAGNOSIS — F4320 Adjustment disorder, unspecified: Secondary | ICD-10-CM | POA: Diagnosis not present

## 2022-12-19 DIAGNOSIS — M25671 Stiffness of right ankle, not elsewhere classified: Secondary | ICD-10-CM | POA: Diagnosis not present

## 2022-12-19 DIAGNOSIS — M25571 Pain in right ankle and joints of right foot: Secondary | ICD-10-CM | POA: Diagnosis not present

## 2022-12-20 ENCOUNTER — Ambulatory Visit: Payer: Commercial Managed Care - PPO | Admitting: Family Medicine

## 2022-12-20 ENCOUNTER — Encounter: Payer: Self-pay | Admitting: Family Medicine

## 2022-12-20 VITALS — BP 102/68 | HR 59 | Temp 97.6°F | Ht 60.0 in | Wt 103.0 lb

## 2022-12-20 DIAGNOSIS — F338 Other recurrent depressive disorders: Secondary | ICD-10-CM

## 2022-12-20 DIAGNOSIS — Z8632 Personal history of gestational diabetes: Secondary | ICD-10-CM

## 2022-12-20 DIAGNOSIS — Z13220 Encounter for screening for lipoid disorders: Secondary | ICD-10-CM | POA: Diagnosis not present

## 2022-12-20 DIAGNOSIS — F4320 Adjustment disorder, unspecified: Secondary | ICD-10-CM | POA: Diagnosis not present

## 2022-12-20 DIAGNOSIS — Z0001 Encounter for general adult medical examination with abnormal findings: Secondary | ICD-10-CM | POA: Diagnosis not present

## 2022-12-20 DIAGNOSIS — L659 Nonscarring hair loss, unspecified: Secondary | ICD-10-CM | POA: Diagnosis not present

## 2022-12-20 DIAGNOSIS — M79674 Pain in right toe(s): Secondary | ICD-10-CM

## 2022-12-20 LAB — COMPREHENSIVE METABOLIC PANEL
ALT: 15 U/L (ref 0–35)
AST: 19 U/L (ref 0–37)
Albumin: 4.5 g/dL (ref 3.5–5.2)
Alkaline Phosphatase: 54 U/L (ref 39–117)
BUN: 10 mg/dL (ref 6–23)
CO2: 23 mEq/L (ref 19–32)
Calcium: 9.4 mg/dL (ref 8.4–10.5)
Chloride: 103 mEq/L (ref 96–112)
Creatinine, Ser: 0.75 mg/dL (ref 0.40–1.20)
GFR: 98.77 mL/min (ref >60.00)
Glucose, Bld: 88 mg/dL (ref 70–99)
Potassium: 4.1 mEq/L (ref 3.5–5.1)
Sodium: 137 mEq/L (ref 135–145)
Total Bilirubin: 0.5 mg/dL (ref 0.2–1.2)
Total Protein: 7.7 g/dL (ref 6.0–8.3)

## 2022-12-20 LAB — LIPID PANEL
Cholesterol: 155 mg/dL (ref 0–200)
HDL: 70.1 mg/dL (ref >39.00)
LDL Cholesterol: 75 mg/dL (ref 0–99)
NonHDL: 85.06
Total CHOL/HDL Ratio: 2
Triglycerides: 49 mg/dL (ref 0.0–149.0)
VLDL: 9.8 mg/dL (ref 0.0–40.0)

## 2022-12-20 LAB — TSH: TSH: 2.22 u[IU]/mL (ref 0.35–5.50)

## 2022-12-20 LAB — CBC WITH DIFFERENTIAL/PLATELET
Basophils Absolute: 0 10*3/uL (ref 0.0–0.1)
Basophils Relative: 0.5 % (ref 0.0–3.0)
Eosinophils Absolute: 0.1 10*3/uL (ref 0.0–0.7)
Eosinophils Relative: 2 % (ref 0.0–5.0)
HCT: 38.9 % (ref 36.0–46.0)
Hemoglobin: 13.4 g/dL (ref 12.0–15.0)
Lymphocytes Relative: 28.8 % (ref 12.0–46.0)
Lymphs Abs: 1.5 10*3/uL (ref 0.7–4.0)
MCHC: 34.4 g/dL (ref 30.0–36.0)
MCV: 90.3 fl (ref 78.0–100.0)
Monocytes Absolute: 0.3 10*3/uL (ref 0.1–1.0)
Monocytes Relative: 5.7 % (ref 3.0–12.0)
Neutro Abs: 3.4 10*3/uL (ref 1.4–7.7)
Neutrophils Relative %: 63 % (ref 43.0–77.0)
Platelets: 297 10*3/uL (ref 150.0–400.0)
RBC: 4.3 Mil/uL (ref 3.87–5.11)
RDW: 13.1 % (ref 11.5–15.5)
WBC: 5.4 10*3/uL (ref 4.0–10.5)

## 2022-12-20 LAB — T4, FREE: Free T4: 0.9 ng/dL (ref 0.60–1.60)

## 2022-12-20 LAB — HEMOGLOBIN A1C: Hgb A1c MFr Bld: 5.2 % (ref 4.6–6.5)

## 2022-12-20 NOTE — Progress Notes (Signed)
Subjective:    Patient ID: Kristy [name], female    DOB: [birth year], 42 y.o.   MRN: [id number]  HPI Chief Complaint  Patient presents with   Annual Exam   She is here for a complete physical exam. Last CPE: 2021 with me at my old practice   Other providers: OB/GYN- Dr. Aloha Gell She is seeing a counselor   Recent lower leg fracture. C/o right great toe tenderness and nail not growing since coming out of the boot.   Noticed thinning hair. No clumps.   Social history:  works at [employer] in [place]  Diet: healthy  Excerise: most days.   Immunizations: UTD. [facility]   Health maintenance:   Mammogram: 07/2022 Colonoscopy: N/A Last Dental Exam: UTD Last Eye Exam: UTD   Wears seatbelt always, uses sunscreen, smoke detectors in home and functioning, does not text while driving and feels safe in home environment.   Reviewed allergies, medications, past medical, surgical, family, and social history.    Review of Systems Review of Systems Constitutional: -fever, -chills, -sweats, -unexpected weight change,-fatigue ENT: -runny nose, -ear pain, -sore throat Cardiology:  -chest pain, -palpitations, -edema Respiratory: -cough, -shortness of breath, -wheezing Gastroenterology: -abdominal pain, -nausea, -vomiting, -diarrhea, -constipation Hematology: -bleeding or bruising problems Musculoskeletal: -arthralgias, -myalgias, -joint swelling, -back pain Ophthalmology: -vision changes Urology: -dysuria, -difficulty urinating, -hematuria, -urinary frequency, -urgency Neurology: -headache, -weakness, -tingling, -numbness       Objective:   Physical Exam BP 102/68 (BP Location: Right Arm, Patient Position: Sitting, Cuff Size: Normal)   Pulse (!) 59   Temp 97.6 F (36.4 C) (Temporal)   Ht 5' (1.524 m)   Wt 103 lb (46.7 kg)   SpO2 96%   BMI 20.12 kg/m   General Appearance:    Alert, cooperative, no distress, appears stated age  Head:    Normocephalic, without obvious abnormality,  atraumatic  Eyes:    PERRL, conjunctiva/corneas clear, EOM's intact  Ears:    TM's with mild scarring and normal external ear canals  Nose:   Nares normal, mucosa normal, no drainage   Throat:   Lips, mucosa, and tongue normal  Neck:   Supple, no lymphadenopathy;  thyroid:  no   enlargement/tenderness/nodules; no JVD  Back:    Spine nontender, no curvature, ROM normal, no CVA     tenderness  Lungs:     Clear to auscultation bilaterally without wheezes, rales or     ronchi; respirations unlabored  Chest Wall:    No tenderness or deformity   Heart:    Regular rate and rhythm, S1 and S2 normal, no murmur, rub   or gallop  Breast Exam:    OB/GYN  Abdomen:     Soft, non-tender, nondistended, normoactive bowel sounds,    no masses, no hepatosplenomegaly  Genitalia:    OB/GYN     Extremities:   No clubbing, cyanosis or edema  Pulses:   2+ and symmetric all extremities  Skin:   Skin color, texture, turgor normal, no rashes or lesions  Lymph nodes:   Cervical, supraclavicular, and axillary nodes normal  Neurologic:   CNII-XII intact, normal strength, sensation and gait          Psych:   Normal mood, affect, hygiene and grooming.          Assessment & Plan:  Encounter for general adult medical examination with abnormal findings - Plan: CBC with Differential/Platelet, Comprehensive metabolic panel, Comprehensive metabolic panel, CBC with Differential/Platelet  History of gestational diabetes -  Plan: Hemoglobin A1c, Hemoglobin A1c  Seasonal affective disorder (HCC)  Great toe pain, right  Hair thinning - Plan: TSH, T4, free, T4, free, TSH  Screening for lipid disorders - Plan: Lipid panel, Lipid panel  Preventive health care reviewed.  She sees OB/GYN and is up-to-date.  We will request her most recent labs, office visit and Pap smear results.  Up-to-date on mammogram.  She sees her dentist and eye doctors regularly. Her mood is good. Discussed starting on a multivitamin that includes  biotin for hair skin and nails.  She will let me know if she would like to see a podiatrist due to persistent right toe pain and great toenail but does not seem to be growing since wearing a boot and injuring her leg. Immunizations are with [place]. Reports being checked for hepatitis C in the past so we did not do this today. Check labs and follow-up.

## 2022-12-20 NOTE — Patient Instructions (Addendum)
Please go downstairs for labs before you leave today.  I recommend starting on a multivitamin which includes biotin for skin hair and nails.  Make sure you are getting at least 2000 IUs of vitamin D3 daily.  Get at least 150 minutes of vigorous physical activity each week.  We will be in touch with your lab results.   Preventive Care 56-42 Years Old, Female Preventive care refers to lifestyle choices and visits with your health care provider that can promote health and wellness. Preventive care visits are also called wellness exams. What can I expect for my preventive care visit? Counseling Your health care provider may ask you questions about your: Medical history, including: Past medical problems. Family medical history. Pregnancy history. Current health, including: Menstrual cycle. Method of birth control. Emotional well-being. Home life and relationship well-being. Sexual activity and sexual health. Lifestyle, including: Alcohol, nicotine or tobacco, and drug use. Access to firearms. Diet, exercise, and sleep habits. Work and work Statistician. Sunscreen use. Safety issues such as seatbelt and bike helmet use. Physical exam Your health care provider will check your: Height and weight. These may be used to calculate your BMI (body mass index). BMI is a measurement that tells if you are at a healthy weight. Waist circumference. This measures the distance around your waistline. This measurement also tells if you are at a healthy weight and may help predict your risk of certain diseases, such as type 2 diabetes and high blood pressure. Heart rate and blood pressure. Body temperature. Skin for abnormal spots. What immunizations do I need?  Vaccines are usually given at various ages, according to a schedule. Your health care provider will recommend vaccines for you based on your age, medical history, and lifestyle or other factors, such as travel or where you work. What tests  do I need? Screening Your health care provider may recommend screening tests for certain conditions. This may include: Lipid and cholesterol levels. Diabetes screening. This is done by checking your blood sugar (glucose) after you have not eaten for a while (fasting). Pelvic exam and Pap test. Hepatitis B test. Hepatitis C test. HIV (human immunodeficiency virus) test. STI (sexually transmitted infection) testing, if you are at risk. Lung cancer screening. Colorectal cancer screening. Mammogram. Talk with your health care provider about when you should start having regular mammograms. This may depend on whether you have a family history of breast cancer. BRCA-related cancer screening. This may be done if you have a family history of breast, ovarian, tubal, or peritoneal cancers. Bone density scan. This is done to screen for osteoporosis. Talk with your health care provider about your test results, treatment options, and if necessary, the need for more tests. Follow these instructions at home: Eating and drinking  Eat a diet that includes fresh fruits and vegetables, whole grains, lean protein, and low-fat dairy products. Take vitamin and mineral supplements as recommended by your health care provider. Do not drink alcohol if: Your health care provider tells you not to drink. You are pregnant, may be pregnant, or are planning to become pregnant. If you drink alcohol: Limit how much you have to 0-1 drink a day. Know how much alcohol is in your drink. In the [place] one drink equals one 12 oz bottle of beer (355 mL), one 5 oz glass of wine (148 mL), or one 1 oz glass of hard liquor (44 mL). Lifestyle Brush your teeth every morning and night with fluoride toothpaste. Floss one time each day. Exercise for at  least 30 minutes 5 or more days each week. Do not use any products that contain nicotine or tobacco. These products include cigarettes, chewing tobacco, and vaping devices, such as  e-cigarettes. If you need help quitting, ask your health care provider. Do not use drugs. If you are sexually active, practice safe sex. Use a condom or other form of protection to prevent STIs. If you do not wish to become pregnant, use a form of birth control. If you plan to become pregnant, see your health care provider for a prepregnancy visit. Take aspirin only as told by your health care provider. Make sure that you understand how much to take and what form to take. Work with your health care provider to find out whether it is safe and beneficial for you to take aspirin daily. Find healthy ways to manage stress, such as: Meditation, yoga, or listening to music. Journaling. Talking to a trusted person. Spending time with friends and family. Minimize exposure to UV radiation to reduce your risk of skin cancer. Safety Always wear your seat belt while driving or riding in a vehicle. Do not drive: If you have been drinking alcohol. Do not ride with someone who has been drinking. When you are tired or distracted. While texting. If you have been using any mind-altering substances or drugs. Wear a helmet and other protective equipment during sports activities. If you have firearms in your house, make sure you follow all gun safety procedures. Seek help if you have been physically or sexually abused. What's next? Visit your health care provider once a year for an annual wellness visit. Ask your health care provider how often you should have your eyes and teeth checked. Stay up to date on all vaccines. This information is not intended to replace advice given to you by your health care provider. Make sure you discuss any questions you have with your health care provider. Document Revised: 04/28/2021 Document Reviewed: 04/28/2021 Elsevier Patient Education  [place].

## 2022-12-22 ENCOUNTER — Encounter: Payer: Self-pay | Admitting: Family Medicine

## 2022-12-26 DIAGNOSIS — M25671 Stiffness of right ankle, not elsewhere classified: Secondary | ICD-10-CM | POA: Diagnosis not present

## 2022-12-26 DIAGNOSIS — M25571 Pain in right ankle and joints of right foot: Secondary | ICD-10-CM | POA: Diagnosis not present

## 2023-01-04 DIAGNOSIS — M25671 Stiffness of right ankle, not elsewhere classified: Secondary | ICD-10-CM | POA: Diagnosis not present

## 2023-01-04 DIAGNOSIS — S8291XD Unspecified fracture of right lower leg, subsequent encounter for closed fracture with routine healing: Secondary | ICD-10-CM | POA: Diagnosis not present

## 2023-01-04 DIAGNOSIS — M25571 Pain in right ankle and joints of right foot: Secondary | ICD-10-CM | POA: Diagnosis not present

## 2023-03-31 DIAGNOSIS — S8291XD Unspecified fracture of right lower leg, subsequent encounter for closed fracture with routine healing: Secondary | ICD-10-CM | POA: Diagnosis not present

## 2023-06-18 IMAGING — MG MM DIGITAL SCREENING BILAT W/ TOMO AND CAD
8 series · 9 of 24 positions shown · non-contrast
Comparison: Previous exam(s).

CLINICAL DATA: Screening.

EXAM:
DIGITAL SCREENING BILATERAL MAMMOGRAM WITH TOMOSYNTHESIS AND CAD
TECHNIQUE: Bilateral screening digital craniocaudal and mediolateral oblique
mammograms were obtained. Bilateral screening digital breast
tomosynthesis was performed. The images were evaluated with
computer-aided detection.

[L MLO synth-2D]
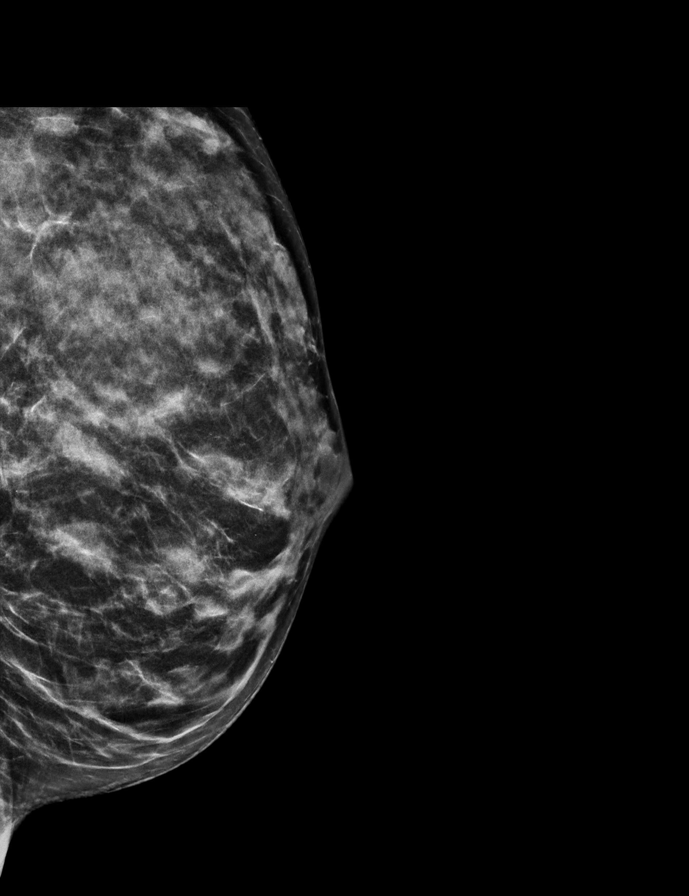

[R MLO synth-2D]
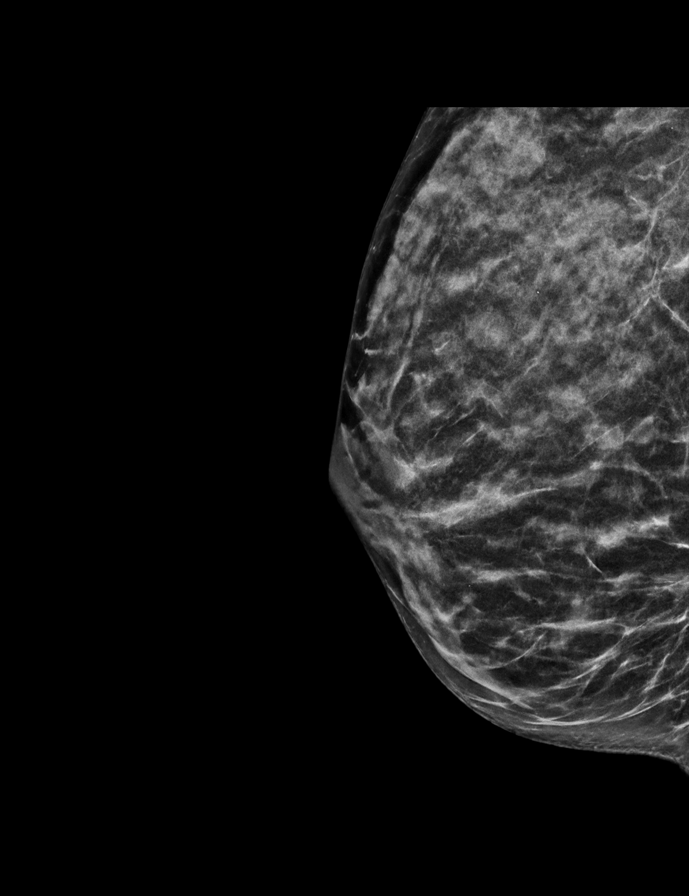

[R CC synth-2D]
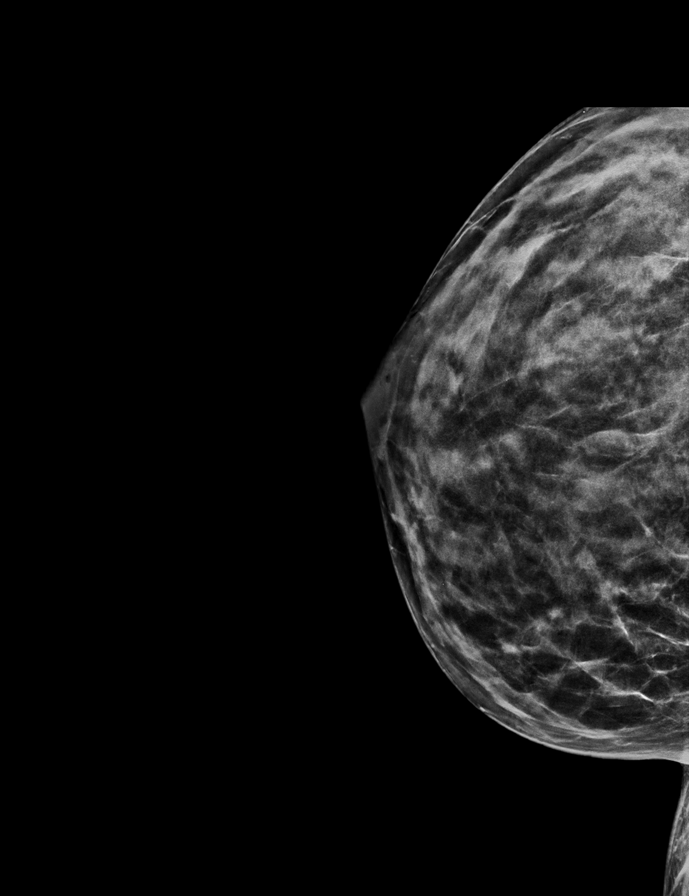

[L CC synth-2D]
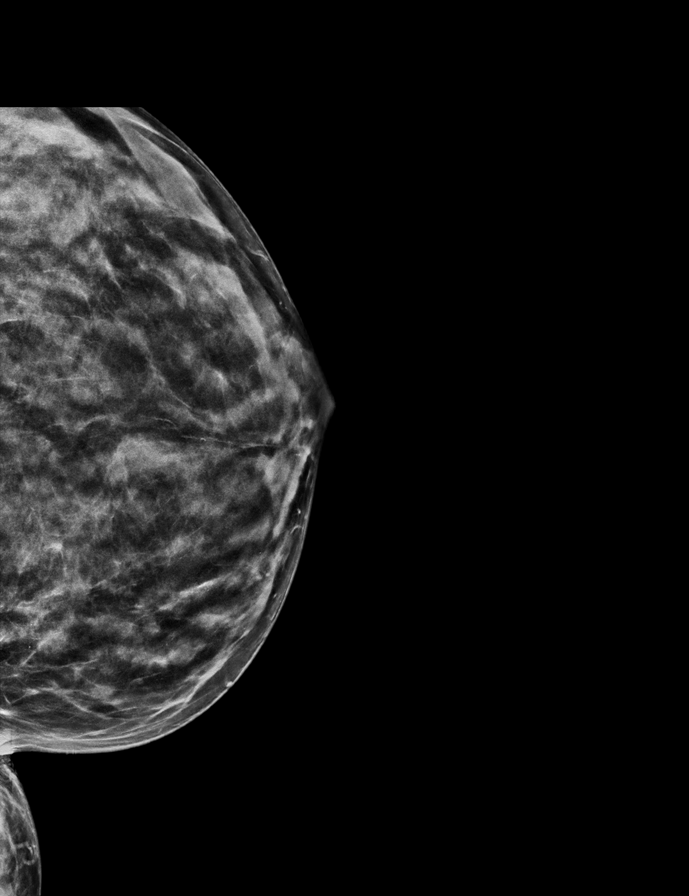

[R CC tomo · 2 of 54 frames shown]
[frame 18/54]
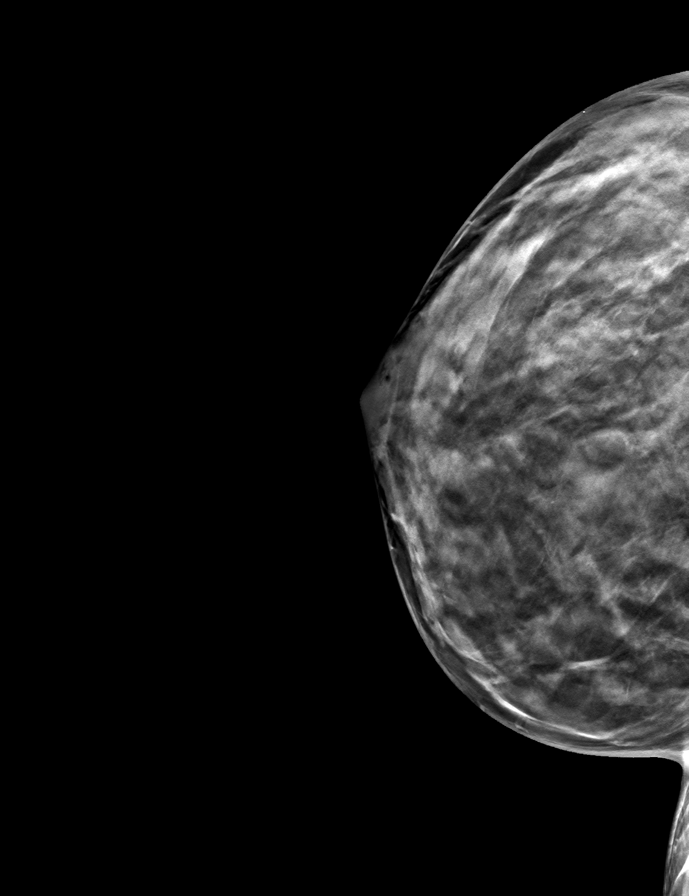
[frame 27/54]
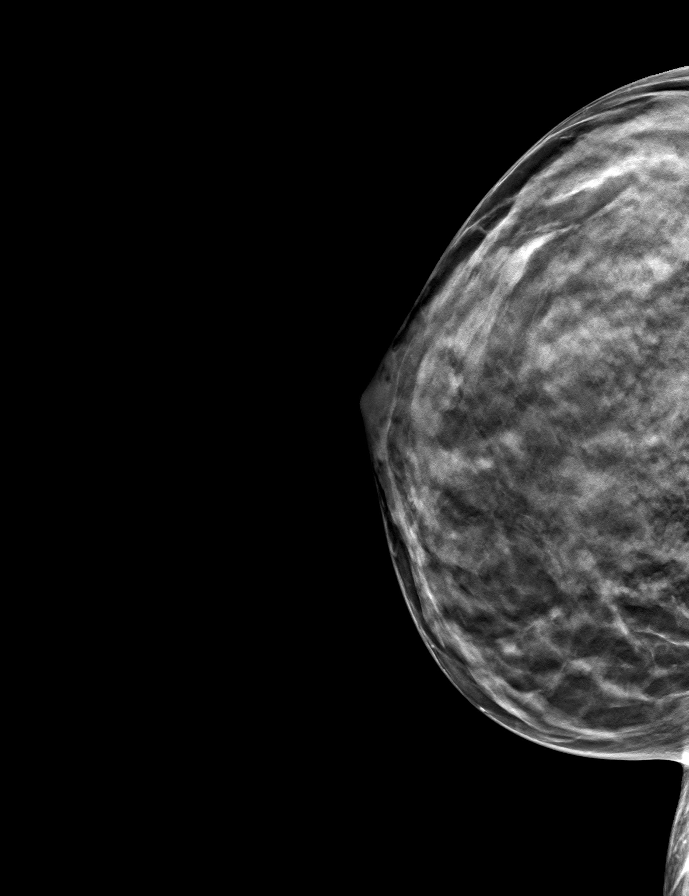

[L MLO tomo · tomo slice 27/52.0]
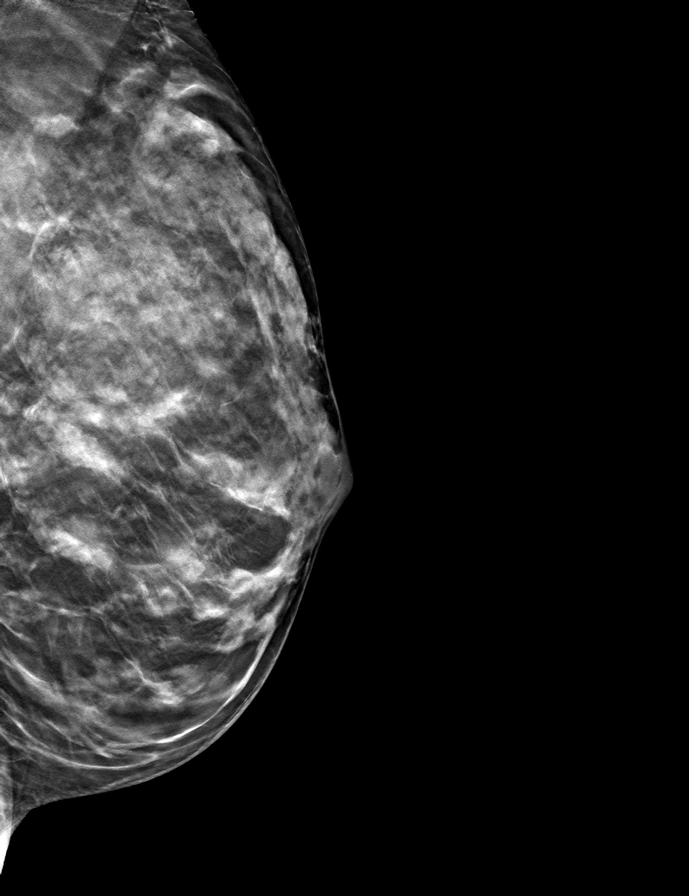

[L CC tomo · tomo slice 27/52.0]
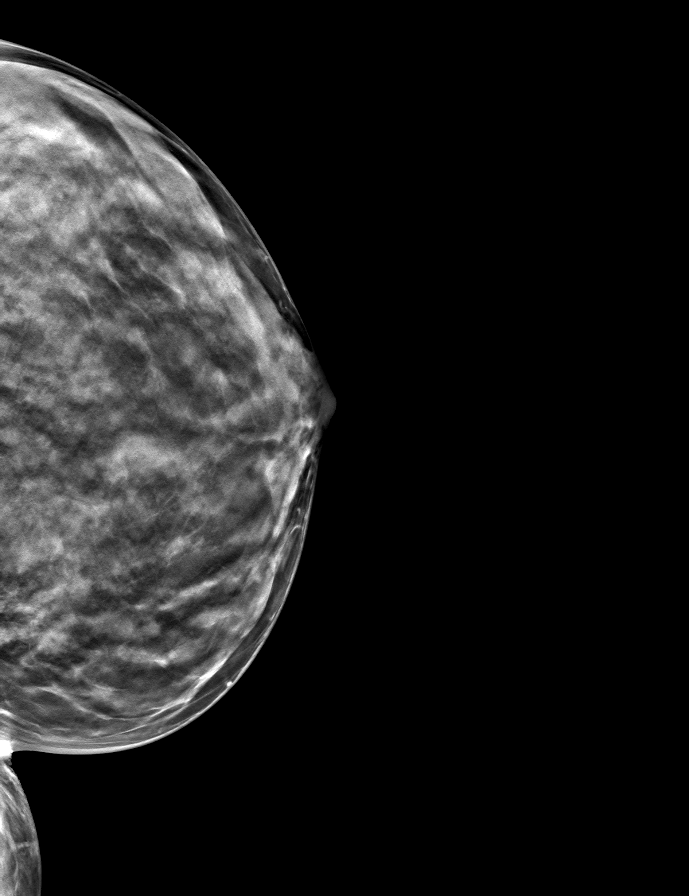

[R MLO tomo · tomo slice 27/52.0]
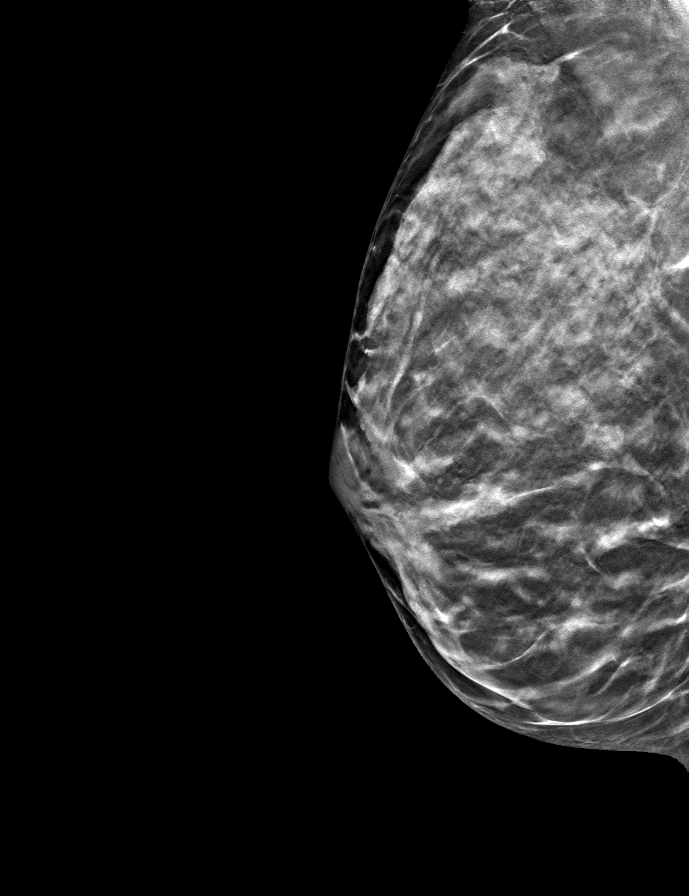

[9 of 24 positions shown; findings below may reference images not displayed]

ACR Breast Density Category c: The breast tissue is heterogeneously
dense, which may obscure small masses.
FINDINGS: There are no findings suspicious for malignancy.
IMPRESSION: No mammographic evidence of malignancy. A result letter of this
screening mammogram will be mailed directly to the patient.

RECOMMENDATION:
Screening mammogram in one year. (Code:Q3-W-BC3)

BI-RADS CATEGORY  1: Negative.

## 2023-06-20 ENCOUNTER — Other Ambulatory Visit (HOSPITAL_BASED_OUTPATIENT_CLINIC_OR_DEPARTMENT_OTHER): Payer: Self-pay

## 2023-08-24 ENCOUNTER — Other Ambulatory Visit: Payer: Self-pay

## 2023-09-07 ENCOUNTER — Other Ambulatory Visit (HOSPITAL_BASED_OUTPATIENT_CLINIC_OR_DEPARTMENT_OTHER): Payer: Self-pay

## 2023-09-07 MED ORDER — INFLUENZA VIRUS VACC SPLIT PF (FLUZONE) 0.5 ML IM SUSY
0.5000 mL | PREFILLED_SYRINGE | Freq: Once | INTRAMUSCULAR | 0 refills | Status: AC
  Filled 2023-09-07: qty 0.5, 1d supply, fill #0

## 2023-09-08 ENCOUNTER — Other Ambulatory Visit (HOSPITAL_BASED_OUTPATIENT_CLINIC_OR_DEPARTMENT_OTHER): Payer: Self-pay

## 2023-12-27 ENCOUNTER — Encounter: Payer: Self-pay | Admitting: Family Medicine

## 2023-12-27 ENCOUNTER — Ambulatory Visit: Payer: Managed Care, Other (non HMO) | Admitting: Family Medicine

## 2023-12-27 ENCOUNTER — Other Ambulatory Visit: Payer: Self-pay | Admitting: Family Medicine

## 2023-12-27 ENCOUNTER — Other Ambulatory Visit (HOSPITAL_BASED_OUTPATIENT_CLINIC_OR_DEPARTMENT_OTHER): Payer: Self-pay

## 2023-12-27 VITALS — BP 102/74 | HR 75 | Temp 97.8°F | Ht 60.0 in | Wt 103.0 lb

## 2023-12-27 DIAGNOSIS — Z1329 Encounter for screening for other suspected endocrine disorder: Secondary | ICD-10-CM | POA: Diagnosis not present

## 2023-12-27 DIAGNOSIS — Z0001 Encounter for general adult medical examination with abnormal findings: Secondary | ICD-10-CM

## 2023-12-27 DIAGNOSIS — Z8632 Personal history of gestational diabetes: Secondary | ICD-10-CM | POA: Diagnosis not present

## 2023-12-27 DIAGNOSIS — Z8639 Personal history of other endocrine, nutritional and metabolic disease: Secondary | ICD-10-CM | POA: Diagnosis not present

## 2023-12-27 DIAGNOSIS — Z0000 Encounter for general adult medical examination without abnormal findings: Secondary | ICD-10-CM

## 2023-12-27 LAB — COMPREHENSIVE METABOLIC PANEL
ALT: 16 U/L (ref 0–35)
AST: 21 U/L (ref 0–37)
Albumin: 4.3 g/dL (ref 3.5–5.2)
Alkaline Phosphatase: 54 U/L (ref 39–117)
BUN: 7 mg/dL (ref 6–23)
CO2: 25 meq/L (ref 19–32)
Calcium: 9 mg/dL (ref 8.4–10.5)
Chloride: 104 meq/L (ref 96–112)
Creatinine, Ser: 0.8 mg/dL (ref 0.40–1.20)
GFR: 90.76 mL/min (ref >60.00)
Glucose, Bld: 89 mg/dL (ref 70–99)
Potassium: 4.4 meq/L (ref 3.5–5.1)
Sodium: 137 meq/L (ref 135–145)
Total Bilirubin: 0.6 mg/dL (ref 0.2–1.2)
Total Protein: 7.4 g/dL (ref 6.0–8.3)

## 2023-12-27 LAB — CBC WITH DIFFERENTIAL/PLATELET
Basophils Absolute: 0 10*3/uL (ref 0.0–0.1)
Basophils Relative: 0.6 % (ref 0.0–3.0)
Eosinophils Absolute: 0.1 10*3/uL (ref 0.0–0.7)
Eosinophils Relative: 2.8 % (ref 0.0–5.0)
HCT: 38.3 % (ref 36.0–46.0)
Hemoglobin: 13.1 g/dL (ref 12.0–15.0)
Lymphocytes Relative: 37.5 % (ref 12.0–46.0)
Lymphs Abs: 1.5 10*3/uL (ref 0.7–4.0)
MCHC: 34.3 g/dL (ref 30.0–36.0)
MCV: 92.6 fL (ref 78.0–100.0)
Monocytes Absolute: 0.2 10*3/uL (ref 0.1–1.0)
Monocytes Relative: 6.2 % (ref 3.0–12.0)
Neutro Abs: 2.1 10*3/uL (ref 1.4–7.7)
Neutrophils Relative %: 52.9 % (ref 43.0–77.0)
Platelets: 284 10*3/uL (ref 150.0–400.0)
RBC: 4.13 Mil/uL (ref 3.87–5.11)
RDW: 12.9 % (ref 11.5–15.5)
WBC: 3.9 10*3/uL — ABNORMAL LOW (ref 4.0–10.5)

## 2023-12-27 LAB — VITAMIN D 25 HYDROXY (VIT D DEFICIENCY, FRACTURES): VITD: 22.97 ng/mL — ABNORMAL LOW (ref 30.00–100.00)

## 2023-12-27 LAB — HEMOGLOBIN A1C: Hgb A1c MFr Bld: 5.2 % (ref 4.6–6.5)

## 2023-12-27 LAB — TSH: TSH: 2.43 u[IU]/mL (ref 0.35–5.50)

## 2023-12-27 MED ORDER — VITAMIN D (ERGOCALCIFEROL) 1.25 MG (50000 UNIT) PO CAPS
50000.0000 [IU] | ORAL_CAPSULE | ORAL | 0 refills | Status: AC
  Filled 2023-12-27: qty 4, 28d supply, fill #0

## 2023-12-27 NOTE — Progress Notes (Signed)
Complete physical exam  Patient: Kristy [name]   DOB: 1981/05/15   43 y.o. Female  MRN: [id number]  Subjective:    Chief Complaint  Patient presents with   Annual Exam   She is here for a complete physical exam.  Hx of vitamin D def and gestational diabetes   Up to date with OB/GYN visit and mammogram   She does yoga and plays soccer        12/27/2023    8:07 AM 12/20/2022    8:20 AM 03/25/2020    8:37 AM 05/16/2019   11:21 AM 12/26/2018    8:18 AM  Depression screen PHQ 2/9  Decreased Interest 0 0 0 0 0  Down, Depressed, Hopeless 0 0 0 0 0  PHQ - 2 Score 0 0 0 0 0      Health Maintenance  Topic Date Due   Hepatitis C Screening  Never done   DTaP/Tdap/Td vaccine (6 - Td or Tdap) 02/02/2023   COVID-19 Vaccine (6 - 2024-25 season) 01/12/2024*   Pap with HPV screening  08/10/2025   Flu Shot  Completed   HIV Screening  Completed   Pneumococcal Vaccination  Aged Out   HPV Vaccine  Aged Out  *Topic was postponed. The date shown is not the original due date.    Wears seatbelt always, uses sunscreen, smoke detectors in home and functioning, does not text while driving, feels safe in home environment.  Depression screening:    12/27/2023    8:07 AM 12/20/2022    8:20 AM 03/25/2020    8:37 AM  Depression screen PHQ 2/9  Decreased Interest 0 0 0  Down, Depressed, Hopeless 0 0 0  PHQ - 2 Score 0 0 0   Anxiety Screening:    05/16/2019   11:21 AM  GAD 7 : Generalized Anxiety Score  Nervous, Anxious, on Edge 1  Control/stop worrying 0  Worry too much - different things 0  Trouble relaxing 0  Restless 0  Easily annoyed or irritable 1  Afraid - awful might happen 0  Total GAD 7 Score 2  Anxiety Difficulty Not difficult at all    Vision:Within last year and Dental: No current dental problems and Receives regular dental care  Patient Active Problem List   Diagnosis Date Noted   Ankle fracture, right 08/10/2022   Complex cyst of left ovary 12/07/2021   Seasonal  affective disorder (HCC) 06/09/2021   Family history of breast cancer 05/31/2021   Cervical intraepithelial neoplasia grade 1 05/31/2021   Insomnia 05/31/2021   History of gestational diabetes 12/26/2018   Past Medical History:  Diagnosis Date   Abnormal Pap smear    Cancer (HCC) 2016   basal skin-removed   Dysrhythmia    tachycardia   Gestational diabetes    Past Surgical History:  Procedure Laterality Date   NO PAST SURGERIES     Social History   Tobacco Use   Smoking status: Never   Smokeless tobacco: Never  Substance Use Topics   Alcohol use: No   Drug use: No      Patient Care Team: Avanell Shackleton, NP-C as PCP - General (Family Medicine)   No outpatient medications prior to visit.   No facility-administered medications prior to visit.    Review of Systems  Constitutional:  Negative for chills, fever, malaise/fatigue and weight loss.  HENT:  Negative for congestion, ear pain, sinus pain and sore throat.   Eyes:  Negative for blurred  vision, double vision and pain.  Respiratory:  Negative for cough, shortness of breath and wheezing.   Cardiovascular:  Negative for chest pain, palpitations and leg swelling.  Gastrointestinal:  Negative for abdominal pain, constipation, diarrhea, nausea and vomiting.  Genitourinary:  Negative for dysuria, frequency and urgency.  Musculoskeletal:  Negative for back pain, joint pain and myalgias.  Skin:  Negative for rash.  Neurological:  Negative for dizziness, tingling, focal weakness and headaches.  Psychiatric/Behavioral:  Negative for depression. The patient is not nervous/anxious.        Objective:    BP 102/74 (BP Location: Left Arm, Patient Position: Sitting, Cuff Size: Normal)   Pulse 75   Temp 97.8 F (36.6 C) (Temporal)   Ht 5' (1.524 m)   Wt 103 lb (46.7 kg)   SpO2 99%   BMI 20.12 kg/m  BP Readings from Last 3 Encounters:  12/27/23 102/74  12/20/22 102/68  08/31/22 112/76   Wt Readings from Last 3  Encounters:  12/27/23 103 lb (46.7 kg)  12/20/22 103 lb (46.7 kg)  08/10/22 100 lb (45.4 kg)    Physical Exam Constitutional:      General: She is not in acute distress.    Appearance: She is not ill-appearing.  HENT:     Right Ear: Tympanic membrane, ear canal and external ear normal.     Left Ear: Tympanic membrane, ear canal and external ear normal.     Nose: Nose normal.     Mouth/Throat:     Mouth: Mucous membranes are moist.     Pharynx: Oropharynx is clear.  Eyes:     Extraocular Movements: Extraocular movements intact.     Conjunctiva/sclera: Conjunctivae normal.     Pupils: Pupils are equal, round, and reactive to light.  Neck:     Thyroid: No thyroid mass, thyromegaly or thyroid tenderness.  Cardiovascular:     Rate and Rhythm: Normal rate and regular rhythm.     Pulses: Normal pulses.     Heart sounds: Normal heart sounds.  Pulmonary:     Effort: Pulmonary effort is normal.     Breath sounds: Normal breath sounds.  Abdominal:     General: Bowel sounds are normal.     Palpations: Abdomen is soft.     Tenderness: There is no abdominal tenderness. There is no right CVA tenderness, left CVA tenderness, guarding or rebound.  Musculoskeletal:        General: Normal range of motion.     Cervical back: Normal range of motion and neck supple. No tenderness.     Right lower leg: No edema.     Left lower leg: No edema.  Lymphadenopathy:     Cervical: No cervical adenopathy.  Skin:    General: Skin is warm and dry.     Findings: No lesion or rash.  Neurological:     General: No focal deficit present.     Mental Status: She is alert and oriented to person, place, and time.     Cranial Nerves: No cranial nerve deficit.     Sensory: No sensory deficit.     Motor: No weakness.     Gait: Gait normal.  Psychiatric:        Mood and Affect: Mood normal.        Behavior: Behavior normal.        Thought Content: Thought content normal.      No results found for any  visits on 12/27/23.    Assessment &  Plan:    Routine Health Maintenance and Physical Exam  Problem List Items Addressed This Visit     History of gestational diabetes   Relevant Orders   Hemoglobin A1c   Other Visit Diagnoses       Encounter for general adult medical examination with abnormal findings    -  Primary   Relevant Orders   CBC with Differential/Platelet   Comprehensive metabolic panel     History of vitamin D deficiency       Relevant Orders   VITAMIN D 25 Hydroxy (Vit-D Deficiency, Fractures)     Screening for thyroid disorder       Relevant Orders   TSH      Preventive health care reviewed. Up to date with OB/GYN.  Counseling on healthy lifestyle including diet and exercise.  Recommend regular dental and eye exams.  Immunizations reviewed.  Discussed safety. Reports Tdap is UTD and hep C negative with OB/GYN.    Return in about 1 year (around 12/26/2024).     Hetty Blend, NP-C

## 2023-12-27 NOTE — Telephone Encounter (Signed)
You have not resulted labs yet

## 2023-12-27 NOTE — Patient Instructions (Addendum)
Please go downstairs for labs before you leave.   Stay hydrated. Get at least 150 minutes of vigorous physical activity each week.

## 2023-12-28 ENCOUNTER — Other Ambulatory Visit (HOSPITAL_BASED_OUTPATIENT_CLINIC_OR_DEPARTMENT_OTHER): Payer: Self-pay

## 2024-09-10 ENCOUNTER — Other Ambulatory Visit (HOSPITAL_BASED_OUTPATIENT_CLINIC_OR_DEPARTMENT_OTHER): Payer: Self-pay

## 2024-09-10 MED ORDER — FLUZONE 0.5 ML IM SUSY
0.5000 mL | PREFILLED_SYRINGE | Freq: Once | INTRAMUSCULAR | 0 refills | Status: AC
  Filled 2024-09-10: qty 0.5, 1d supply, fill #0

## 2025-01-03 ENCOUNTER — Encounter: Admitting: Family Medicine
